# Patient Record
Sex: Female | Born: 1945 | Race: White | Hispanic: No | Smoking: Never smoker
Health system: Southern US, Community
[De-identification: ages and names within clinical notes are randomized; demographics above are authoritative.]

## PROBLEM LIST (undated history)

## (undated) DIAGNOSIS — I493 Ventricular premature depolarization: Secondary | ICD-10-CM

## (undated) DIAGNOSIS — N63 Unspecified lump in unspecified breast: Secondary | ICD-10-CM

## (undated) DIAGNOSIS — R011 Cardiac murmur, unspecified: Secondary | ICD-10-CM

## (undated) DIAGNOSIS — E785 Hyperlipidemia, unspecified: Secondary | ICD-10-CM

## (undated) HISTORY — DX: Ventricular premature depolarization: I49.3

## (undated) HISTORY — DX: Unspecified lump in unspecified breast: N63.0

## (undated) HISTORY — DX: Hyperlipidemia, unspecified: E78.5

## (undated) HISTORY — DX: Cardiac murmur, unspecified: R01.1

## (undated) HISTORY — PX: FOOT FRACTURE SURGERY: SHX645

---

## 2006-07-26 ENCOUNTER — Ambulatory Visit: Payer: Self-pay | Admitting: Family Medicine

## 2006-08-05 ENCOUNTER — Encounter: Admission: RE | Admit: 2006-08-05 | Discharge: 2006-08-05 | Payer: Self-pay | Admitting: Family Medicine

## 2006-08-19 ENCOUNTER — Encounter: Admission: RE | Admit: 2006-08-19 | Discharge: 2006-08-19 | Payer: Self-pay | Admitting: Family Medicine

## 2006-08-26 ENCOUNTER — Other Ambulatory Visit: Admission: RE | Admit: 2006-08-26 | Discharge: 2006-08-26 | Payer: Self-pay | Admitting: Family Medicine

## 2006-08-26 ENCOUNTER — Ambulatory Visit: Payer: Self-pay | Admitting: Family Medicine

## 2006-08-26 ENCOUNTER — Encounter (INDEPENDENT_AMBULATORY_CARE_PROVIDER_SITE_OTHER): Payer: Self-pay | Admitting: Family Medicine

## 2006-08-26 LAB — CONVERTED CEMR LAB
HCT: 39.3 % (ref 36.0–46.0)
HDL: 80.8 mg/dL (ref >39.0)
MCHC: 32.6 g/dL (ref 30.0–36.0)
MCV: 93.3 fL (ref 78.0–100.0)
RDW: 11.2 % — ABNORMAL LOW (ref 11.5–14.6)
TSH: 1.05 microintl units/mL (ref 0.35–5.50)
VLDL: 11 mg/dL (ref 0–40)
WBC: 4.5 10*3/uL (ref 4.5–10.5)

## 2006-09-17 ENCOUNTER — Ambulatory Visit: Payer: Self-pay | Admitting: Internal Medicine

## 2007-01-16 DIAGNOSIS — N63 Unspecified lump in unspecified breast: Secondary | ICD-10-CM | POA: Insufficient documentation

## 2007-01-16 DIAGNOSIS — E739 Lactose intolerance, unspecified: Secondary | ICD-10-CM | POA: Insufficient documentation

## 2007-10-17 ENCOUNTER — Telehealth (INDEPENDENT_AMBULATORY_CARE_PROVIDER_SITE_OTHER): Payer: Self-pay | Admitting: *Deleted

## 2007-10-20 ENCOUNTER — Ambulatory Visit: Payer: Self-pay | Admitting: Internal Medicine

## 2007-10-20 DIAGNOSIS — I868 Varicose veins of other specified sites: Secondary | ICD-10-CM

## 2007-10-20 DIAGNOSIS — I8390 Asymptomatic varicose veins of unspecified lower extremity: Secondary | ICD-10-CM | POA: Insufficient documentation

## 2007-10-20 DIAGNOSIS — G5600 Carpal tunnel syndrome, unspecified upper limb: Secondary | ICD-10-CM | POA: Insufficient documentation

## 2009-09-04 ENCOUNTER — Encounter: Payer: Self-pay | Admitting: Internal Medicine

## 2010-11-11 ENCOUNTER — Encounter: Payer: Self-pay | Admitting: Family Medicine

## 2010-12-22 ENCOUNTER — Other Ambulatory Visit: Payer: Self-pay | Admitting: Family Medicine

## 2010-12-22 ENCOUNTER — Encounter: Payer: Self-pay | Admitting: Family Medicine

## 2010-12-22 ENCOUNTER — Encounter (INDEPENDENT_AMBULATORY_CARE_PROVIDER_SITE_OTHER): Payer: BC Managed Care – PPO | Admitting: Family Medicine

## 2010-12-22 ENCOUNTER — Other Ambulatory Visit (HOSPITAL_COMMUNITY)
Admission: RE | Admit: 2010-12-22 | Discharge: 2010-12-22 | Disposition: A | Discharge status: Home / Self Care | Payer: BC Managed Care – PPO | Source: Ambulatory Visit | Attending: Family Medicine | Admitting: Family Medicine

## 2010-12-22 DIAGNOSIS — Z0000 Encounter for general adult medical examination without abnormal findings: Secondary | ICD-10-CM

## 2010-12-22 DIAGNOSIS — Z01419 Encounter for gynecological examination (general) (routine) without abnormal findings: Secondary | ICD-10-CM

## 2010-12-22 DIAGNOSIS — N63 Unspecified lump in unspecified breast: Secondary | ICD-10-CM

## 2010-12-22 DIAGNOSIS — E785 Hyperlipidemia, unspecified: Secondary | ICD-10-CM

## 2010-12-22 DIAGNOSIS — L719 Rosacea, unspecified: Secondary | ICD-10-CM

## 2010-12-22 DIAGNOSIS — Z124 Encounter for screening for malignant neoplasm of cervix: Secondary | ICD-10-CM

## 2010-12-22 DIAGNOSIS — Z780 Asymptomatic menopausal state: Secondary | ICD-10-CM

## 2010-12-22 LAB — BASIC METABOLIC PANEL
BUN: 20 mg/dL (ref 6–23)
CO2: 29 mEq/L (ref 19–32)
Calcium: 9.1 mg/dL (ref 8.4–10.5)
Chloride: 104 mEq/L (ref 96–112)
Creatinine, Ser: 0.8 mg/dL (ref 0.4–1.2)

## 2010-12-22 LAB — LIPID PANEL
Cholesterol: 226 mg/dL — ABNORMAL HIGH (ref 0–200)
Triglycerides: 27 mg/dL (ref 0.0–149.0)
VLDL: 5.4 mg/dL (ref 0.0–40.0)

## 2010-12-22 LAB — CBC WITH DIFFERENTIAL/PLATELET
Basophils Absolute: 0 10*3/uL (ref 0.0–0.1)
Eosinophils Absolute: 0.1 10*3/uL (ref 0.0–0.7)
Lymphocytes Relative: 32.8 % (ref 12.0–46.0)
MCHC: 33.8 g/dL (ref 30.0–36.0)
MCV: 93.2 fl (ref 78.0–100.0)
Monocytes Absolute: 0.4 10*3/uL (ref 0.1–1.0)
Neutrophils Relative %: 57.2 % (ref 43.0–77.0)
Platelets: 280 10*3/uL (ref 150.0–400.0)

## 2010-12-22 LAB — HEPATIC FUNCTION PANEL
Bilirubin, Direct: 0.1 mg/dL (ref 0.0–0.3)
Total Bilirubin: 0.5 mg/dL (ref 0.3–1.2)

## 2010-12-22 LAB — LDL CHOLESTEROL, DIRECT: Direct LDL: 127.6 mg/dL

## 2010-12-25 LAB — CONVERTED CEMR LAB: Vit D, 25-Hydroxy: 17 ng/mL — ABNORMAL LOW (ref 30–89)

## 2010-12-26 ENCOUNTER — Encounter (INDEPENDENT_AMBULATORY_CARE_PROVIDER_SITE_OTHER): Payer: Self-pay | Admitting: *Deleted

## 2010-12-29 ENCOUNTER — Ambulatory Visit
Admission: RE | Admit: 2010-12-29 | Discharge: 2010-12-29 | Disposition: A | Discharge status: Home / Self Care | Payer: BC Managed Care – PPO | Source: Ambulatory Visit | Attending: Family Medicine | Admitting: Family Medicine

## 2010-12-29 ENCOUNTER — Other Ambulatory Visit: Payer: Self-pay | Admitting: Family Medicine

## 2010-12-29 DIAGNOSIS — Z780 Asymptomatic menopausal state: Secondary | ICD-10-CM

## 2010-12-29 DIAGNOSIS — N63 Unspecified lump in unspecified breast: Secondary | ICD-10-CM

## 2011-01-02 NOTE — Letter (Signed)
Summary: Pre Visit Letter Revised  [facility]  [address] [place], [place] [address]   Phone: [phone]  Fax: [phone]        12/26/2010 MRN: [id number] [facility] [address] [place], [place]  [address]  [place]             Procedure Date:  02/05/2011 @ 9:00   Direct colon-Dr. Marina Goodell   Welcome to the Gastroenterology Division at [facility].    You are scheduled to see a nurse for your pre-procedure visit on 01/22/2011 at 10:30 on the 3rd floor at [facility], [address]  We ask that you try to arrive at our office 15 minutes prior to your appointment time to allow for check-in.  Please take a minute to review the attached form.  If you answer "Yes" to one or more of the questions on the first page, we ask that you call the person listed at your earliest opportunity.  If you answer "No" to all of the questions, please complete the rest of the form and bring it to your appointment.    Your nurse visit will consist of discussing your medical and surgical history, your immediate family medical history, and your medications.   If you are unable to list all of your medications on the form, please bring the medication bottles to your appointment and we will list them.  We will need to be aware of both prescribed and over the counter drugs.  We will need to know exact dosage information as well.    Please be prepared to read and sign documents such as consent forms, a financial agreement, and acknowledgement forms.  If necessary, and with your consent, a friend or relative is welcome to sit-in on the nurse visit with you.  Please bring your insurance card so that we may make a copy of it.  If your insurance requires a referral to see a specialist, please bring your referral form from your primary care physician.  No co-pay is required for this nurse visit.     If you cannot keep your appointment, please call [phone] to cancel or reschedule  prior to your appointment date.  This allows [place] the opportunity to schedule an appointment for another patient in need of care.    Thank you for choosing [facility] for your medical needs.  We appreciate the opportunity to care for you.  Please visit [place] at our website  to learn more about our practice.  Sincerely, The Gastroenterology Division

## 2011-01-09 NOTE — Assessment & Plan Note (Signed)
Summary: PHYSICAL AND FASTING LABS, [employer], RASH ON FACE FOR 4 WEEKS///SPH   Vital Signs:  Patient profile:   65 year old female Height:      62.25 inches Weight:      135 pounds Temp:     97.5 degrees F oral Pulse rate:   62 / minute Resp:     16 per minute BP sitting:   118 / 76  (left arm)  Vitals Entered By: Jeremy Johann CMA (December 22, 2010 10:31 AM) CC: CPX, fasting, pap smear   History of Present Illness: 65 yo woman here today for CPE.  last mammogram >2 yrs ago, overdue on pap.  has never had colonoscopy due to out of pocket costs.  facial rash- this is 4th week of current breakout, has been having similar sxs 'for years'.  denies itching or pain.  pt feels this may be rosacea.  will frequently have areas between eyes, extending onto forehead, chin, and malar region.  has never seen dermatology.  Preventive Screening-Counseling & Management  Alcohol-Tobacco     Alcohol drinks/day: 0     Smoking Status: never  Caffeine-Diet-Exercise     Does Patient Exercise: yes     Type of exercise: walking, [place]      Drug Use:  never.    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  Past Surgical History: none  Family History: Breast Cancer- no Alzheimer's- mother limited info due to foster system  Social History: divorced no children retired Runner, broadcasting/film/video- works as a Engineer, technical sales and sub was a foster child lives w/ catSmoking Status:  never Does Patient Exercise:  yes Drug Use:  never  Review of Systems  The patient denies anorexia, fever, weight loss, weight gain, vision loss, decreased hearing, hoarseness, chest pain, syncope, dyspnea on exertion, peripheral edema, prolonged cough, headaches, abdominal pain, melena, hematochezia, severe indigestion/heartburn, hematuria, suspicious skin lesions, depression, abnormal bleeding,  enlarged lymph nodes, and breast masses.    Physical Exam  General:  Well-developed,well-nourished,in no acute distress; alert,appropriate and cooperative throughout examination Head:  Normocephalic and atraumatic without obvious abnormalities. No apparent alopecia or balding. Eyes:  No corneal or conjunctival inflammation noted. EOMI. Perrla. Funduscopic exam benign, without hemorrhages, exudates or papilledema. Vision grossly normal. Ears:  External ear exam shows no significant lesions or deformities.  Otoscopic examination reveals clear canals, tympanic membranes are intact bilaterally without bulging, retraction, inflammation or discharge. Hearing is grossly normal bilaterally. Nose:  External nasal examination shows no deformity or inflammation. Nasal mucosa are pink and moist without lesions or exudates. Mouth:  Oral mucosa and oropharynx without lesions or exudates.  Teeth in good repair. Neck:  No deformities, masses, or tenderness noted. Breasts:  small freely mobile mass in L upper outer quadrant  ~1 o'clock.  remainder of breast exam normal Lungs:  Normal respiratory effort, chest expands symmetrically. Lungs are clear to auscultation, no crackles or wheezes. Heart:  reg S1/S2, faint midsystolic click. Abdomen:  Bowel sounds positive,abdomen soft and non-tender without masses, organomegaly or hernias noted. Genitalia:  Pelvic Exam:        External: normal female genitalia without lesions or masses        Vagina: normal without lesions or masses        Cervix: normal without lesions or masses        Adnexa: normal bimanual exam without masses or fullness  Uterus: normal by palpation        Pap smear: performed Pulses:  +2 carotid, radial, DP Extremities:  No clubbing, cyanosis, edema, or deformity noted with normal full range of motion of all joints.   Neurologic:  No cranial nerve deficits noted. Station and gait are normal. Plantar reflexes are down-going bilaterally. DTRs  are symmetrical throughout. Sensory, motor and coordinative functions appear intact. Skin:  erythematous, dry, scaling rash in malar distribution, between eyebrows, and extending onto chin. Cervical Nodes:  No lymphadenopathy noted Axillary Nodes:  No palpable lymphadenopathy Psych:  Cognition and judgment appear intact. Alert and cooperative with normal attention span and concentration. No apparent delusions, illusions, hallucinations   Impression & Recommendations:  Problem # 1:  ROUTINE GYNECOLOGICAL EXAMINATION (ICD-V72.31) Assessment New pt's PE WNL w/ exception of rash (see below).  check labs.  refer for mammogram, DEXA, colonoscopy. 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  Keep up the good work! 2)  We'll notify you of your lab results 3)  Someone will call you with your dermatology appt, your GI appt, and your mammogram/bone density appts 4)  Call with any questions or concerns 5)  Welcome Back!   Orders Added: 1)  Venipuncture [36415] 2)  T-Vitamin D (25-Hydroxy) [29562-13086] 3)  EKG w/ Interpretation [93000] 4)  Specimen Handling [99000] 5)  Dermatology Referral [Derma] 6)  Gastroenterology Referral [GI] 7)  Radiology Referral [Radiology] 8)  TLB-Lipid Panel [80061-LIPID] 9)  TLB-BMP (Basic  Metabolic Panel-BMET) [80048-METABOL] 10)  TLB-CBC Platelet - w/Differential [85025-CBCD] 11)  TLB-Hepatic/Liver Function Pnl [80076-HEPATIC] 12)  TLB-TSH (Thyroid Stimulating Hormone) [84443-TSH] 13)  New Patient 40-64 years [phone]

## 2011-01-22 ENCOUNTER — Telehealth: Payer: Self-pay | Admitting: *Deleted

## 2011-01-22 NOTE — Telephone Encounter (Signed)
Left message for pt. To [facility] her colon and PV.  Sent NOS letter

## 2011-01-22 NOTE — Telephone Encounter (Signed)
Pt. Called and cancelled her colon.

## 2011-02-05 ENCOUNTER — Other Ambulatory Visit: Payer: BC Managed Care – PPO | Admitting: Internal Medicine

## 2011-03-09 NOTE — Assessment & Plan Note (Signed)
[facility]                          [place] OFFICE NOTE   Marilyn Hess, Marilyn Hess                    MRN:          [id number]  DATE:07/26/2006                            DOB:          1946-08-29    Marilyn Hess is a 65 year old female presenting here to establish care.  She  has no significant past medical history but complains of a long history of  leg pain associated with varicose veins.  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  This has been associated with pain and swelling  of the veins.  It has progressively worsened over the last several years.  She has used over-the-counter support hose but has never used compression  stockings.  She was wondering if there was any treatment available.   PAST MEDICAL HISTORY:  1. 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  2. Lactose intolerant.  3. Fracture of the foot in 1988.   MEDICATIONS:  None.   ALLERGIES:  NO KNOWN DRUG ALLERGIES.   FAMILY HISTORY:  Marilyn Hess was adopted by her foster parents but there is a  family history of hypertension and arthritis.   SOCIAL HISTORY:  The patient is a retired Runner, broadcasting/film/video currently working as a  Lawyer.  She is divorced with no children.  Denies any alcohol  or tobacco use.   REVIEW OF SYSTEMS:  As per HPI, otherwise unremarkable.   HEALTH MAINTENANCE:  The patient states that she has not had her mammogram  or Pap Smear in 7+ years.  Additionally, she has never had a colonoscopy.  __________.   OBJECTIVE:  Weight 139, temperature 97.6, pulse 64, blood pressure 138/80.  GENERAL:  We have a pleasant female in no acute distress, answers questions  appropriately.  NECK:  Supple.  No lymphadenopathy, carotid bruits or JVD, no thyromegaly.  LUNGS:  Clear.  HEART:  Regular rate and rhythm.   Normal S1, S2.  No murmurs, gallops or  rubs heard on my examination today.  EXTREMITIES:  There is marked varicosity extending from the mid thigh to the  ankle.  Some are more prominent than others and palpable.  No lower  extremity edema appreciated.  Pulses are 1+ and equal bilaterally.   IMPRESSION:  A 60yld female with no significant past medical history  presenting with symptomatic varicosities of the lower extremities.   PLAN:  1. Prescribed compression stockings at 20-30 mmHg of pressure.  Advised to      wear daily.  2. Will refer patient to [facility] for further evaluation and      treatment.  3. Regarding her health maintenance issues, will schedule her for a      mammogram as well as a screening for her colonoscopy by      Gastroenterology.  4. Patient will schedule a follow up visit for a complete physical      examination including breast and pelvic exam.  Patient expressed  understanding.            ______________________________  Leanne Chang, M.D.      LA/MedQ  DD:  07/26/2006  DT:  07/29/2006  Job #:  [id number]

## 2011-07-23 HISTORY — PX: TOOTH EXTRACTION: SUR596

## 2012-07-10 ENCOUNTER — Encounter: Payer: Self-pay | Admitting: *Deleted

## 2012-07-10 ENCOUNTER — Ambulatory Visit (INDEPENDENT_AMBULATORY_CARE_PROVIDER_SITE_OTHER): Payer: BC Managed Care – PPO | Admitting: Family Medicine

## 2012-07-10 ENCOUNTER — Encounter: Payer: Self-pay | Admitting: Family Medicine

## 2012-07-10 VITALS — BP 117/72 | HR 73 | Temp 97.8°F | Ht 62.25 in | Wt 146.4 lb

## 2012-07-10 DIAGNOSIS — Z1239 Encounter for other screening for malignant neoplasm of breast: Secondary | ICD-10-CM

## 2012-07-10 DIAGNOSIS — M8580 Other specified disorders of bone density and structure, unspecified site: Secondary | ICD-10-CM

## 2012-07-10 DIAGNOSIS — Z1211 Encounter for screening for malignant neoplasm of colon: Secondary | ICD-10-CM

## 2012-07-10 DIAGNOSIS — M899 Disorder of bone, unspecified: Secondary | ICD-10-CM

## 2012-07-10 DIAGNOSIS — Z0000 Encounter for general adult medical examination without abnormal findings: Secondary | ICD-10-CM | POA: Insufficient documentation

## 2012-07-10 LAB — CBC WITH DIFFERENTIAL/PLATELET
Basophils Absolute: 0 10*3/uL (ref 0.0–0.1)
Basophils Relative: 0.8 % (ref 0.0–3.0)
Eosinophils Relative: 1.8 % (ref 0.0–5.0)
Hemoglobin: 12.9 g/dL (ref 12.0–15.0)
Lymphocytes Relative: 30.4 % (ref 12.0–46.0)
Monocytes Relative: 9.4 % (ref 3.0–12.0)
Neutro Abs: 2.6 10*3/uL (ref 1.4–7.7)
RBC: 4.26 Mil/uL (ref 3.87–5.11)
RDW: 12.6 % (ref 11.5–14.6)
WBC: 4.5 10*3/uL (ref 4.5–10.5)

## 2012-07-10 LAB — HEPATIC FUNCTION PANEL
ALT: 15 U/L (ref 0–35)
Bilirubin, Direct: 0 mg/dL (ref 0.0–0.3)
Total Protein: 6.8 g/dL (ref 6.0–8.3)

## 2012-07-10 LAB — LIPID PANEL
Cholesterol: 227 mg/dL — ABNORMAL HIGH (ref 0–200)
Total CHOL/HDL Ratio: 3

## 2012-07-10 LAB — BASIC METABOLIC PANEL
CO2: 29 mEq/L (ref 19–32)
Chloride: 104 mEq/L (ref 96–112)
Creatinine, Ser: 0.8 mg/dL (ref 0.4–1.2)

## 2012-07-10 LAB — LDL CHOLESTEROL, DIRECT: Direct LDL: 118.4 mg/dL

## 2012-07-10 NOTE — Progress Notes (Signed)
  Subjective:    Patient ID: Marilyn Hess, female    DOB: [birth year], 66 y.o.   MRN: [id number]  HPI CPE- due for mammo ([facility]), has never had colonoscopy due to cost- was told it may be up to $2600.  Had pap last year.     Review of Systems Patient reports no vision/ hearing changes, adenopathy,fever, weight change,  persistant/recurrent hoarseness , swallowing issues, chest pain, palpitations, edema, persistant/recurrent cough, hemoptysis, dyspnea (rest/exertional/paroxysmal nocturnal), gastrointestinal bleeding (melena, rectal bleeding), abdominal pain, significant heartburn, bowel changes, GU symptoms (dysuria, hematuria, incontinence), Gyn symptoms (abnormal  bleeding, pain),  syncope, focal weakness, memory loss, skin/hair/nail changes, abnormal bruising or bleeding, anxiety, or depression.   + numbness in hands due to carpal tunnel    Objective:   Physical Exam  General Appearance:    Alert, cooperative, no distress, appears stated age  Head:    Normocephalic, without obvious abnormality, atraumatic  Eyes:    PERRL, conjunctiva/corneas clear, EOM's intact, fundi    benign, both eyes  Ears:    Normal TM's and external ear canals, both ears  Nose:   Nares normal, septum midline, mucosa normal, no drainage    or sinus tenderness  Throat:   Lips, mucosa, and tongue normal; teeth and gums normal  Neck:   Supple, symmetrical, trachea midline, no adenopathy;    Thyroid: no enlargement/tenderness/nodules  Back:     Symmetric, no curvature, ROM normal, no CVA tenderness  Lungs:     Clear to auscultation bilaterally, respirations unlabored  Chest Wall:    No tenderness or deformity   Heart:    Regular rate and rhythm, S1 and S2 normal, no murmur, rub   or gallop  Breast Exam:    No tenderness, masses, or nipple abnormality  Abdomen:     Soft, non-tender, bowel sounds active all four quadrants,    no masses, no organomegaly  Genitalia:    Deferred at pt request this  year  Rectal:    Extremities:   Extremities normal, atraumatic, no cyanosis or edema  Pulses:   2+ and symmetric all extremities  Skin:   Skin color, texture, turgor normal, no rashes or lesions  Lymph nodes:   Cervical, supraclavicular, and axillary nodes normal  Neurologic:   CNII-XII intact, normal strength, sensation and reflexes    throughout          Assessment & Plan:

## 2012-07-10 NOTE — Patient Instructions (Addendum)
Follow up in 1 year or as needed We'll notify you of your lab results and make any changes if needed Start 2 caltrate daily We'll call you with your GI and mammo appts Call with any questions or concerns Happy Early Birthday!!

## 2012-07-13 LAB — VITAMIN D 1,25 DIHYDROXY
Vitamin D 1, 25 (OH)2 Total: 44 pg/mL (ref 18–72)
Vitamin D2 1, 25 (OH)2: <8 pg/mL

## 2012-07-15 NOTE — Assessment & Plan Note (Signed)
Pt's PE WNL.  Refer for colonoscopy- pt to check w/ insurance.  Will also refer for mammo.  Check labs.  Anticipatory guidance provided.

## 2012-07-15 NOTE — Assessment & Plan Note (Signed)
Check Vit D level.  Replete prn. °

## 2012-07-16 ENCOUNTER — Encounter: Payer: Self-pay | Admitting: *Deleted

## 2012-07-17 ENCOUNTER — Ambulatory Visit
Admission: RE | Admit: 2012-07-17 | Discharge: 2012-07-17 | Disposition: A | Discharge status: Home / Self Care | Payer: BC Managed Care – PPO | Source: Ambulatory Visit | Attending: Family Medicine | Admitting: Family Medicine

## 2012-07-17 DIAGNOSIS — Z1239 Encounter for other screening for malignant neoplasm of breast: Secondary | ICD-10-CM

## 2012-07-21 ENCOUNTER — Encounter: Payer: Self-pay | Admitting: Family Medicine

## 2012-08-04 ENCOUNTER — Telehealth: Payer: Self-pay | Admitting: Family Medicine

## 2012-08-04 NOTE — Telephone Encounter (Signed)
In reference to gastroenterology referral entered on 07/10/12, [facility], and I have attempted to reach patient, left messages, and I also mailed reminder letter to patient.  This patient will not respond.

## 2012-08-04 NOTE — Telephone Encounter (Signed)
Noted thank you

## 2014-08-05 ENCOUNTER — Ambulatory Visit (INDEPENDENT_AMBULATORY_CARE_PROVIDER_SITE_OTHER): Payer: Medicare PPO | Admitting: Family Medicine

## 2014-08-05 ENCOUNTER — Encounter: Payer: Self-pay | Admitting: Family Medicine

## 2014-08-05 VITALS — BP 122/82 | HR 76 | Temp 98.0°F | Resp 16 | Ht 62.0 in | Wt 142.2 lb

## 2014-08-05 DIAGNOSIS — Z1211 Encounter for screening for malignant neoplasm of colon: Secondary | ICD-10-CM

## 2014-08-05 DIAGNOSIS — G5602 Carpal tunnel syndrome, left upper limb: Secondary | ICD-10-CM

## 2014-08-05 DIAGNOSIS — G5603 Carpal tunnel syndrome, bilateral upper limbs: Secondary | ICD-10-CM

## 2014-08-05 DIAGNOSIS — Z1231 Encounter for screening mammogram for malignant neoplasm of breast: Secondary | ICD-10-CM

## 2014-08-05 DIAGNOSIS — Z0000 Encounter for general adult medical examination without abnormal findings: Secondary | ICD-10-CM | POA: Diagnosis not present

## 2014-08-05 DIAGNOSIS — M8580 Other specified disorders of bone density and structure, unspecified site: Secondary | ICD-10-CM

## 2014-08-05 DIAGNOSIS — G5601 Carpal tunnel syndrome, right upper limb: Secondary | ICD-10-CM

## 2014-08-05 DIAGNOSIS — Z79899 Other long term (current) drug therapy: Secondary | ICD-10-CM

## 2014-08-05 DIAGNOSIS — E663 Overweight: Secondary | ICD-10-CM | POA: Diagnosis not present

## 2014-08-05 DIAGNOSIS — Z23 Encounter for immunization: Secondary | ICD-10-CM

## 2014-08-05 LAB — LIPID PANEL
CHOL/HDL RATIO: 3
CHOLESTEROL: 238 mg/dL — AB (ref 0–200)
HDL: 77.1 mg/dL (ref >39.00)
LDL CALC: 148 mg/dL — AB (ref 0–99)
NonHDL: 160.9
TRIGLYCERIDES: 67 mg/dL (ref 0.0–149.0)
VLDL: 13.4 mg/dL (ref 0.0–40.0)

## 2014-08-05 LAB — CBC WITH DIFFERENTIAL/PLATELET
BASOS ABS: 0 10*3/uL (ref 0.0–0.1)
Basophils Relative: 0.7 % (ref 0.0–3.0)
EOS PCT: 2.9 % (ref 0.0–5.0)
Eosinophils Absolute: 0.1 10*3/uL (ref 0.0–0.7)
HEMATOCRIT: 40.2 % (ref 36.0–46.0)
HEMOGLOBIN: 13.2 g/dL (ref 12.0–15.0)
LYMPHS PCT: 34.8 % (ref 12.0–46.0)
Lymphs Abs: 1.7 10*3/uL (ref 0.7–4.0)
MCHC: 32.8 g/dL (ref 30.0–36.0)
MCV: 91.9 fl (ref 78.0–100.0)
MONOS PCT: 8.1 % (ref 3.0–12.0)
Monocytes Absolute: 0.4 10*3/uL (ref 0.1–1.0)
NEUTROS ABS: 2.6 10*3/uL (ref 1.4–7.7)
Neutrophils Relative %: 53.5 % (ref 43.0–77.0)
Platelets: 263 10*3/uL (ref 150.0–400.0)
RBC: 4.38 Mil/uL (ref 3.87–5.11)
RDW: 12.4 % (ref 11.5–15.5)
WBC: 4.9 10*3/uL (ref 4.0–10.5)

## 2014-08-05 LAB — HEPATIC FUNCTION PANEL
ALBUMIN: 3.8 g/dL (ref 3.5–5.2)
ALK PHOS: 59 U/L (ref 39–117)
ALT: 14 U/L (ref 0–35)
AST: 27 U/L (ref 0–37)
Bilirubin, Direct: 0.1 mg/dL (ref 0.0–0.3)
TOTAL PROTEIN: 7.2 g/dL (ref 6.0–8.3)
Total Bilirubin: 0.9 mg/dL (ref 0.2–1.2)

## 2014-08-05 LAB — BASIC METABOLIC PANEL
BUN: 15 mg/dL (ref 6–23)
CALCIUM: 9.7 mg/dL (ref 8.4–10.5)
CO2: 24 mEq/L (ref 19–32)
CREATININE: 0.9 mg/dL (ref 0.4–1.2)
Chloride: 103 mEq/L (ref 96–112)
GFR: 66.57 mL/min (ref >60.00)
GLUCOSE: 87 mg/dL (ref 70–99)
Potassium: 4.9 mEq/L (ref 3.5–5.1)
Sodium: 141 mEq/L (ref 135–145)

## 2014-08-05 LAB — TSH: TSH: 1.68 u[IU]/mL (ref 0.35–4.50)

## 2014-08-05 LAB — VITAMIN D 25 HYDROXY (VIT D DEFICIENCY, FRACTURES): VITD: 31.1 ng/mL (ref 30.00–100.00)

## 2014-08-05 NOTE — Assessment & Plan Note (Signed)
New.  Check labs to risk stratify.  Encouraged healthy, low carb diet and regular exercise.  Pt expressed understanding and is in agreement w/ plan.

## 2014-08-05 NOTE — Progress Notes (Signed)
Pre visit review using our clinic review tool, if applicable. No additional management support is needed unless otherwise documented below in the visit note. 

## 2014-08-05 NOTE — Assessment & Plan Note (Signed)
Due for DEXA- order entered.  Encouraged daily use of Ca and Vit D

## 2014-08-05 NOTE — Progress Notes (Signed)
   Subjective:    Patient ID: Marilyn Hess, female    DOB: 1946/03/15, 68 y.o.   MRN: [id number]  HPI Here today for CPE.  Risk Factors: Overweight- pt not following particular diet.  BMI 26 Osteopenia- noted on last DEXA done 2012, not currently on Ca or Vit D Physical Activity: walking regularly, Silver Sneakers Fall Risk: low risk Depression: denies current Hearing: normal to conversational tones and whispered voice at 6 ft ADL's: independent Cognitive: normal linear thought process, memory and attention intact Home Safety: safe at home Height, Weight, BMI, Visual Acuity: see vitals, vision corrected to 20/20 w/ glasses Counseling: Overdue on DEXA, [facility] ([facility]), no need for paps, pt has never had colonoscopy. Labs Ordered: See A&P Care Plan: See A&P    Review of Systems Patient reports no vision/ hearing changes, adenopathy,fever, weight change,  persistant/recurrent hoarseness , swallowing issues, chest pain, palpitations, edema, persistant/recurrent cough, hemoptysis, dyspnea (rest/exertional/paroxysmal nocturnal), gastrointestinal bleeding (melena, rectal bleeding), abdominal pain, significant heartburn, bowel changes, GU symptoms (dysuria, hematuria, incontinence), Gyn symptoms (abnormal  bleeding, pain), syncope, memory loss, skin/hair/nail changes, abnormal bruising or bleeding, anxiety, or depression.   + numbness in hands consistent w/ carpal tunnel- worsening x2-3 weeks    Objective:   Physical Exam General Appearance:    Alert, cooperative, no distress, appears stated age  Head:    Normocephalic, without obvious abnormality, atraumatic  Eyes:    PERRL, conjunctiva/corneas clear, EOM's intact, fundi    benign, both eyes  Ears:    Normal TM's and external ear canals, both ears  Nose:   Nares normal, septum midline, mucosa normal, no drainage    or sinus tenderness  Throat:   Lips, mucosa, and tongue normal; teeth and gums normal  Neck:   Supple,  symmetrical, trachea midline, no adenopathy;    Thyroid: no enlargement/tenderness/nodules  Back:     Symmetric, no curvature, ROM normal, no CVA tenderness  Lungs:     Clear to auscultation bilaterally, respirations unlabored  Chest Wall:    No tenderness or deformity   Heart:    Regular rate and rhythm, S1 and S2 normal, no murmur, rub   or gallop  Breast Exam:    Deferred to mammo  Abdomen:     Soft, non-tender, bowel sounds active all four quadrants,    no masses, no organomegaly  Genitalia:    Deferred  Rectal:    Extremities:   Extremities normal, atraumatic, no cyanosis or edema  Pulses:   2+ and symmetric all extremities  Skin:   Skin color, texture, turgor normal, no rashes or lesions  Lymph nodes:   Cervical, supraclavicular, and axillary nodes normal  Neurologic:   CNII-XII intact, normal strength, sensation and reflexes    throughout          Assessment & Plan:

## 2014-08-05 NOTE — Patient Instructions (Signed)
Follow up in 1 year or as needed We'll notify you of your lab results We'll call you with your mammo, bone density, GI, and hand specialist appts We did the Prevnar today.  Get your flu shot at the pharmacy Call with any questions or concerns Keep up the good work!

## 2014-08-05 NOTE — Assessment & Plan Note (Signed)
Pt's PE WNL w/ exception of being mildly overweight.  Due for mammo, DEXA, colonoscopy- orders/referrals entered.  Written screening schedule updated and given to pt.  Check labs.  Anticipatory guidance provided.

## 2014-08-05 NOTE — Assessment & Plan Note (Signed)
Deteriorated.  Pt having near constant numbness now.  Refer to hand specialist.  Pt expressed understanding and is in agreement w/ plan.

## 2014-08-06 ENCOUNTER — Encounter: Payer: Self-pay | Admitting: General Practice

## 2014-08-24 ENCOUNTER — Ambulatory Visit
Admission: RE | Admit: 2014-08-24 | Discharge: 2014-08-24 | Disposition: A | Discharge status: Home / Self Care | Payer: Medicare PPO | Source: Ambulatory Visit | Attending: Family Medicine | Admitting: Family Medicine

## 2014-08-24 ENCOUNTER — Encounter: Payer: Self-pay | Admitting: General Practice

## 2014-08-24 ENCOUNTER — Other Ambulatory Visit: Payer: Self-pay | Admitting: Family Medicine

## 2014-08-24 DIAGNOSIS — M8580 Other specified disorders of bone density and structure, unspecified site: Secondary | ICD-10-CM

## 2014-08-24 DIAGNOSIS — Z1231 Encounter for screening mammogram for malignant neoplasm of breast: Secondary | ICD-10-CM

## 2014-09-13 ENCOUNTER — Encounter: Payer: Self-pay | Admitting: Family Medicine

## 2014-10-22 HISTORY — PX: COLONOSCOPY: SHX174

## 2014-11-22 HISTORY — PX: CARPAL TUNNEL RELEASE: SHX101

## 2015-02-07 ENCOUNTER — Encounter: Payer: Self-pay | Admitting: Family Medicine

## 2015-02-07 ENCOUNTER — Ambulatory Visit (INDEPENDENT_AMBULATORY_CARE_PROVIDER_SITE_OTHER): Payer: Medicare PPO | Admitting: Family Medicine

## 2015-02-07 VITALS — BP 130/80 | HR 68 | Temp 98.0°F | Resp 16 | Wt 142.0 lb

## 2015-02-07 DIAGNOSIS — E785 Hyperlipidemia, unspecified: Secondary | ICD-10-CM | POA: Insufficient documentation

## 2015-02-07 NOTE — Assessment & Plan Note (Signed)
Noted on last labs.  Pt prefers diet and exercise to medication.  Check labs and determine if meds are necessary.  Pt expressed understanding and is in agreement w/ plan.

## 2015-02-07 NOTE — Patient Instructions (Signed)
Schedule your complete physical in 6 months We'll notify you of your lab results and make any changes if needed Keep up the good work on healthy diet and regular exercise- you look great! Call with any questions or concerns Happy Spring!  Enjoy the beach!!!

## 2015-02-07 NOTE — Progress Notes (Signed)
   Subjective:    Patient ID: Marilyn Hess, female    DOB: [birth year], 69 y.o.   MRN: [id number]  HPI Hyperlipidemia- noted on labs done 6 months ago.  Pt declined medication and wanted to work on healthy diet and regular exercise.  Denies CP, SOB, HAs, visual changes, abd pain, N/V.   Review of Systems For ROS see HPI     Objective:   Physical Exam  Constitutional: She is oriented to person, place, and time. She appears well-developed and well-nourished. No distress.  HENT:  Head: Normocephalic and atraumatic.  Eyes: Conjunctivae and EOM are normal. Pupils are equal, round, and reactive to light.  Neck: Normal range of motion. Neck supple. No thyromegaly present.  Cardiovascular: Normal rate, regular rhythm, normal heart sounds and intact distal pulses.   No murmur heard. Pulmonary/Chest: Effort normal and breath sounds normal. No respiratory distress.  Abdominal: Soft. She exhibits no distension. There is no tenderness.  Musculoskeletal: She exhibits no edema.  Lymphadenopathy:    She has no cervical adenopathy.  Neurological: She is alert and oriented to person, place, and time.  Skin: Skin is warm and dry.  Psychiatric: She has a normal mood and affect. Her behavior is normal.  Vitals reviewed.         Assessment & Plan:

## 2015-02-07 NOTE — Progress Notes (Signed)
Pre visit review using our clinic review tool, if applicable. No additional management support is needed unless otherwise documented below in the visit note. 

## 2015-02-16 ENCOUNTER — Telehealth: Payer: Self-pay | Admitting: *Deleted

## 2015-02-16 NOTE — Telephone Encounter (Signed)
Spoke with pt to schedule appointment redraw - bmp , hep , lipid. [place] at beach . Pt to call back to schedule redraw.

## 2015-02-23 ENCOUNTER — Other Ambulatory Visit (INDEPENDENT_AMBULATORY_CARE_PROVIDER_SITE_OTHER): Payer: Medicare PPO

## 2015-02-23 DIAGNOSIS — E785 Hyperlipidemia, unspecified: Secondary | ICD-10-CM

## 2015-02-23 LAB — BASIC METABOLIC PANEL
BUN: 21 mg/dL (ref 6–23)
CHLORIDE: 104 meq/L (ref 96–112)
CO2: 30 mEq/L (ref 19–32)
Calcium: 9.4 mg/dL (ref 8.4–10.5)
Creatinine, Ser: 0.88 mg/dL (ref 0.40–1.20)
GFR: 68.2 mL/min (ref >60.00)
Glucose, Bld: 76 mg/dL (ref 70–99)
POTASSIUM: 4.5 meq/L (ref 3.5–5.1)
Sodium: 139 mEq/L (ref 135–145)

## 2015-02-23 LAB — HEPATIC FUNCTION PANEL
ALBUMIN: 4.2 g/dL (ref 3.5–5.2)
ALT: 24 U/L (ref 0–35)
AST: 22 U/L (ref 0–37)
Alkaline Phosphatase: 63 U/L (ref 39–117)
BILIRUBIN DIRECT: 0.1 mg/dL (ref 0.0–0.3)
BILIRUBIN TOTAL: 0.3 mg/dL (ref 0.2–1.2)
TOTAL PROTEIN: 6.6 g/dL (ref 6.0–8.3)

## 2015-02-23 LAB — LIPID PANEL
CHOL/HDL RATIO: 3
Cholesterol: 225 mg/dL — ABNORMAL HIGH (ref 0–200)
HDL: 79.4 mg/dL (ref >39.00)
LDL CALC: 138 mg/dL — AB (ref 0–99)
NonHDL: 145.6
TRIGLYCERIDES: 37 mg/dL (ref 0.0–149.0)
VLDL: 7.4 mg/dL (ref 0.0–40.0)

## 2015-02-23 NOTE — Addendum Note (Signed)
Addended by: Harl Bowie on: 02/23/2015 11:42 AM   Modules accepted: Orders

## 2015-06-06 ENCOUNTER — Telehealth: Payer: Self-pay | Admitting: Family Medicine

## 2015-06-06 NOTE — Telephone Encounter (Signed)
°  Relation to WO:EHOZ Call back [phone] Pharmacy:  Reason for call: pt states that her cat bit her on her finger, pt wants to check to see if she should come in for a tetanus shot, states its a little painful but she has been using alcohol and peroxide.

## 2015-06-06 NOTE — Telephone Encounter (Signed)
No record of tetanus on file, please advise on if Tdap or regular td should be given, I will have [facility] triage to verify that cat is UTD on vaccinations.

## 2015-06-06 NOTE — Telephone Encounter (Signed)
Notified patient and she scheduled nurse visit for tdap 06/07/15.

## 2015-06-06 NOTE — Telephone Encounter (Signed)
Yes- needs Tdap and it needs to be documented in response to cat bite so that insurance will cover

## 2015-06-06 NOTE — Telephone Encounter (Signed)
Can you please triage pt, and verify that her cat is up to date on vaccinations and please schedule an appt for Tdap in regards to cat bite?

## 2015-06-07 ENCOUNTER — Ambulatory Visit (INDEPENDENT_AMBULATORY_CARE_PROVIDER_SITE_OTHER): Payer: Medicare PPO | Admitting: *Deleted

## 2015-06-07 DIAGNOSIS — Z23 Encounter for immunization: Secondary | ICD-10-CM

## 2015-06-07 DIAGNOSIS — W5501XA Bitten by cat, initial encounter: Secondary | ICD-10-CM | POA: Diagnosis not present

## 2015-06-07 NOTE — Progress Notes (Signed)
Pre visit review using our clinic review tool, if applicable. No additional management support is needed unless otherwise documented below in the visit note.  Patient tolerated injection well.  

## 2015-08-08 ENCOUNTER — Encounter: Payer: Self-pay | Admitting: Family Medicine

## 2015-08-08 ENCOUNTER — Ambulatory Visit (INDEPENDENT_AMBULATORY_CARE_PROVIDER_SITE_OTHER): Payer: Medicare PPO | Admitting: Family Medicine

## 2015-08-08 VITALS — BP 126/80 | HR 42 | Temp 98.1°F | Resp 16 | Ht 62.0 in | Wt 142.0 lb

## 2015-08-08 DIAGNOSIS — M8580 Other specified disorders of bone density and structure, unspecified site: Secondary | ICD-10-CM

## 2015-08-08 DIAGNOSIS — Z0000 Encounter for general adult medical examination without abnormal findings: Secondary | ICD-10-CM | POA: Diagnosis not present

## 2015-08-08 DIAGNOSIS — Z1211 Encounter for screening for malignant neoplasm of colon: Secondary | ICD-10-CM

## 2015-08-08 DIAGNOSIS — Z23 Encounter for immunization: Secondary | ICD-10-CM | POA: Diagnosis not present

## 2015-08-08 DIAGNOSIS — E785 Hyperlipidemia, unspecified: Secondary | ICD-10-CM

## 2015-08-08 DIAGNOSIS — E663 Overweight: Secondary | ICD-10-CM

## 2015-08-08 LAB — HEPATIC FUNCTION PANEL
ALK PHOS: 58 U/L (ref 39–117)
ALT: 23 U/L (ref 0–35)
AST: 28 U/L (ref 0–37)
Albumin: 4 g/dL (ref 3.5–5.2)
Bilirubin, Direct: 0.1 mg/dL (ref 0.0–0.3)
Total Bilirubin: 0.4 mg/dL (ref 0.2–1.2)
Total Protein: 6.4 g/dL (ref 6.0–8.3)

## 2015-08-08 LAB — BASIC METABOLIC PANEL
BUN: 25 mg/dL — ABNORMAL HIGH (ref 6–23)
CO2: 29 mEq/L (ref 19–32)
Calcium: 9.1 mg/dL (ref 8.4–10.5)
Chloride: 105 mEq/L (ref 96–112)
Creatinine, Ser: 0.86 mg/dL (ref 0.40–1.20)
GFR: 69.94 mL/min (ref >60.00)
Glucose, Bld: 88 mg/dL (ref 70–99)
POTASSIUM: 3.8 meq/L (ref 3.5–5.1)
Sodium: 140 mEq/L (ref 135–145)

## 2015-08-08 LAB — CBC WITH DIFFERENTIAL/PLATELET
Basophils Absolute: 0 10*3/uL (ref 0.0–0.1)
Basophils Relative: 1 % (ref 0.0–3.0)
EOS PCT: 2.1 % (ref 0.0–5.0)
Eosinophils Absolute: 0.1 10*3/uL (ref 0.0–0.7)
HCT: 39.1 % (ref 36.0–46.0)
Hemoglobin: 13 g/dL (ref 12.0–15.0)
LYMPHS ABS: 1.8 10*3/uL (ref 0.7–4.0)
Lymphocytes Relative: 35.2 % (ref 12.0–46.0)
MCHC: 33.3 g/dL (ref 30.0–36.0)
MCV: 92.2 fl (ref 78.0–100.0)
MONOS PCT: 9.4 % (ref 3.0–12.0)
Monocytes Absolute: 0.5 10*3/uL (ref 0.1–1.0)
Neutro Abs: 2.7 10*3/uL (ref 1.4–7.7)
Neutrophils Relative %: 52.3 % (ref 43.0–77.0)
Platelets: 252 10*3/uL (ref 150.0–400.0)
RBC: 4.24 Mil/uL (ref 3.87–5.11)
RDW: 12.5 % (ref 11.5–15.5)
WBC: 5.1 10*3/uL (ref 4.0–10.5)

## 2015-08-08 LAB — LIPID PANEL
CHOL/HDL RATIO: 3
Cholesterol: 233 mg/dL — ABNORMAL HIGH (ref 0–200)
HDL: 90.4 mg/dL (ref >39.00)
LDL CALC: 133 mg/dL — AB (ref 0–99)
NonHDL: 142.93
TRIGLYCERIDES: 51 mg/dL (ref 0.0–149.0)
VLDL: 10.2 mg/dL (ref 0.0–40.0)

## 2015-08-08 LAB — TSH: TSH: 1.37 u[IU]/mL (ref 0.35–4.50)

## 2015-08-08 LAB — VITAMIN D 25 HYDROXY (VIT D DEFICIENCY, FRACTURES): VITD: 21.43 ng/mL — ABNORMAL LOW (ref 30.00–100.00)

## 2015-08-08 NOTE — Assessment & Plan Note (Signed)
Very mild.  Pt is exercising regularly but not as much as previously.  Check labs to risk stratify.  Encouraged healthy diet and regular exercise.  Will follow.

## 2015-08-08 NOTE — Progress Notes (Signed)
Pre visit review using our clinic review tool, if applicable. No additional management support is needed unless otherwise documented below in the visit note. 

## 2015-08-08 NOTE — Assessment & Plan Note (Signed)
UTD on DEXA- due 2017.  Check Vit D.  Replete prn.

## 2015-08-08 NOTE — Assessment & Plan Note (Signed)
Pt's PE WNL.  UTD on mammo and DEXA.  Due for colonoscopy (she has never had)- order entered.  Written screening schedule updated and given to pt.  Flu shot and pneumovax given.  Check labs.  Anticipatory guidance provided.

## 2015-08-08 NOTE — Progress Notes (Signed)
   Subjective:    Patient ID: Negin Hegg, female    DOB: 11-14-1945, 69 y.o.   MRN: [id number]  HPI Here today for CPE.  Risk Factors: Hyperlipidemia- chronic problem.  Attempting to control w/ healthy diet and regular exercise. Overweight- pt's BMI is 26.  She is exercising regularly, although not as much as previously. Physical Activity: exercising regularly, enjoys walking Fall Risk: low Depression: denies Hearing: normal to conversational tones and whispered voice at 6 ft ADL's: independent Cognitive: normal linear thought process, memory and attention intact Home Safety: safe at home Height, Weight, BMI, Visual Acuity: see vitals, vision corrected to 20/20 w/ glasses Counseling: due for colonoscopy- pt has never had, mammo- due in Nov.  Pt to get flu and pneumovax Care team reviewed and updated w/ pt Labs Ordered: See A&P Care Plan: See A&P    Review of Systems Patient reports no vision/ hearing changes, adenopathy,fever, weight change,  persistant/recurrent hoarseness , swallowing issues, chest pain, palpitations, edema, persistant/recurrent cough, hemoptysis, dyspnea (rest/exertional/paroxysmal nocturnal), gastrointestinal bleeding (melena, rectal bleeding), abdominal pain, significant heartburn, bowel changes, GU symptoms (dysuria, hematuria, incontinence), Gyn symptoms (abnormal  bleeding, pain),  syncope, focal weakness, memory loss, numbness & tingling, skin/hair/nail changes, abnormal bruising or bleeding, anxiety, or depression.     Objective:   Physical Exam General Appearance:    Alert, cooperative, no distress, appears stated age  Head:    Normocephalic, without obvious abnormality, atraumatic  Eyes:    PERRL, conjunctiva/corneas clear, EOM's intact, fundi    benign, both eyes  Ears:    Normal TM's and external ear canals, both ears  Nose:   Nares normal, septum midline, mucosa normal, no drainage    or sinus tenderness  Throat:   Lips, mucosa, and tongue  normal; teeth and gums normal  Neck:   Supple, symmetrical, trachea midline, no adenopathy;    Thyroid: no enlargement/tenderness/nodules  Back:     Symmetric, no curvature, ROM normal, no CVA tenderness  Lungs:     Clear to auscultation bilaterally, respirations unlabored  Chest Wall:    No tenderness or deformity   Heart:    Regular rate and rhythm, S1 and S2 normal, no murmur, rub   or gallop  Breast Exam:    Deferred to mammo  Abdomen:     Soft, non-tender, bowel sounds active all four quadrants,    no masses, no organomegaly  Genitalia:    Deferred  Rectal:    Extremities:   Extremities normal, atraumatic, no cyanosis or edema  Pulses:   2+ and symmetric all extremities  Skin:   Skin color, texture, turgor normal, no rashes or lesions  Lymph nodes:   Cervical, supraclavicular, and axillary nodes normal  Neurologic:   CNII-XII intact, normal strength, sensation and reflexes    throughout          Assessment & Plan:

## 2015-08-08 NOTE — Assessment & Plan Note (Signed)
Ongoing issue for pt.  She is attempting to control w/ healthy diet and regular exercise.  She has never been on meds.  Check labs.  Start meds prn.

## 2015-08-08 NOTE — Patient Instructions (Signed)
Follow up in 6 months to recheck cholesterol We'll notify you of your lab results and make any changes if needed Keep up the good work on healthy diet and regular exercise- you look great! Schedule your mammogram when you get your reminder in the mail We'll call you with your GI appt for the colonoscopy consultation Call with any questions or concerns If you want to join [place] at the new [employer] office, any scheduled appointments will automatically transfer and we will see you at [address] [place] [address] [place], [place] [address]  Happy Fall!!!

## 2015-08-09 ENCOUNTER — Other Ambulatory Visit: Payer: Self-pay | Admitting: General Practice

## 2015-08-09 MED ORDER — VITAMIN D (ERGOCALCIFEROL) 1.25 MG (50000 UNIT) PO CAPS: 50000.0000 [IU] | ORAL_CAPSULE | ORAL | Status: DC

## 2015-08-15 ENCOUNTER — Encounter: Payer: Self-pay | Admitting: Internal Medicine

## 2015-09-21 ENCOUNTER — Ambulatory Visit (AMBULATORY_SURGERY_CENTER): Payer: Self-pay

## 2015-09-21 VITALS — Ht 62.0 in | Wt 143.2 lb

## 2015-09-21 DIAGNOSIS — Z1211 Encounter for screening for malignant neoplasm of colon: Secondary | ICD-10-CM

## 2015-09-21 MED ORDER — SUPREP BOWEL PREP KIT 17.5-3.13-1.6 GM/177ML PO SOLN: 1.0000 | Freq: Once | ORAL | Status: DC

## 2015-09-21 NOTE — Progress Notes (Signed)
No allergies to eggs or soy No past problems with anesthesia except "it takes longer to take effect and then it lasts longer on me than most people" No diet/weight loss meds No home oxygen  Has email and internet; registered for emmi

## 2015-09-22 ENCOUNTER — Encounter: Payer: Self-pay | Admitting: Internal Medicine

## 2015-10-03 ENCOUNTER — Other Ambulatory Visit: Payer: Self-pay | Admitting: General Practice

## 2015-10-03 MED ORDER — VITAMIN D (ERGOCALCIFEROL) 1.25 MG (50000 UNIT) PO CAPS: 50000.0000 [IU] | ORAL_CAPSULE | ORAL | Status: DC

## 2015-10-05 ENCOUNTER — Encounter: Payer: Self-pay | Admitting: Internal Medicine

## 2015-10-05 ENCOUNTER — Ambulatory Visit (AMBULATORY_SURGERY_CENTER): Payer: Medicare PPO | Admitting: Internal Medicine

## 2015-10-05 VITALS — BP 125/71 | HR 74 | Temp 97.5°F | Resp 25 | Ht 62.0 in | Wt 143.0 lb

## 2015-10-05 DIAGNOSIS — D120 Benign neoplasm of cecum: Secondary | ICD-10-CM | POA: Diagnosis not present

## 2015-10-05 DIAGNOSIS — Z1211 Encounter for screening for malignant neoplasm of colon: Secondary | ICD-10-CM

## 2015-10-05 DIAGNOSIS — E669 Obesity, unspecified: Secondary | ICD-10-CM | POA: Diagnosis not present

## 2015-10-05 MED ORDER — SODIUM CHLORIDE 0.9 % IV SOLN: 500.0000 mL | INTRAVENOUS | Status: DC

## 2015-10-05 NOTE — Progress Notes (Signed)
Called to room to assist during endoscopic procedure.  Patient ID and intended procedure confirmed with present staff. Received instructions for my participation in the procedure from the performing physician.  

## 2015-10-05 NOTE — Progress Notes (Signed)
Report to PACU, RN, vss, BBS= Clear.  

## 2015-10-05 NOTE — Patient Instructions (Signed)
Discharge instructions given. Handout on polyps. Resume previous medications. YOU HAD AN ENDOSCOPIC PROCEDURE TODAY AT THE [facility]:   Refer to the procedure report that was given to you for any specific questions about what was found during the examination.  If the procedure report does not answer your questions, please call your gastroenterologist to clarify.  If you requested that your care partner not be given the details of your procedure findings, then the procedure report has been included in a sealed envelope for you to review at your convenience later.  YOU SHOULD EXPECT: Some feelings of bloating in the abdomen. Passage of more gas than usual.  Walking can help get rid of the air that was put into your GI tract during the procedure and reduce the bloating. If you had a lower endoscopy (such as a colonoscopy or flexible sigmoidoscopy) you may notice spotting of blood in your stool or on the toilet paper. If you underwent a bowel prep for your procedure, you may not have a normal bowel movement for a few days.  Please Note:  You might notice some irritation and congestion in your nose or some drainage.  This is from the oxygen used during your procedure.  There is no need for concern and it should clear up in a day or so.  SYMPTOMS TO REPORT IMMEDIATELY:   Following lower endoscopy (colonoscopy or flexible sigmoidoscopy):  Excessive amounts of blood in the stool  Significant tenderness or worsening of abdominal pains  Swelling of the abdomen that is new, acute  Fever of 100F or higher   For urgent or emergent issues, a gastroenterologist can be reached at any hour by calling [phone].   DIET: Your first meal following the procedure should be a small meal and then it is ok to progress to your normal diet. Heavy or fried foods are harder to digest and may make you feel nauseous or bloated.  Likewise, meals heavy in dairy and vegetables can increase bloating.  Drink  plenty of fluids but you should avoid alcoholic beverages for 24 hours.  ACTIVITY:  You should plan to take it easy for the rest of today and you should NOT DRIVE or use heavy machinery until tomorrow (because of the sedation medicines used during the test).    FOLLOW UP: Our staff will call the number listed on your records the next business day following your procedure to check on you and address any questions or concerns that you may have regarding the information given to you following your procedure. If we do not reach you, we will leave a message.  However, if you are feeling well and you are not experiencing any problems, there is no need to return our call.  We will assume that you have returned to your regular daily activities without incident.  If any biopsies were taken you will be contacted by phone or by letter within the next 1-3 weeks.  Please call us at [phone] if you have not heard about the biopsies in 3 weeks.    SIGNATURES/CONFIDENTIALITY: You and/or your care partner have signed paperwork which will be entered into your electronic medical record.  These signatures attest to the fact that that the information above on your After Visit Summary has been reviewed and is understood.  Full responsibility of the confidentiality of this discharge information lies with you and/or your care-partner. 

## 2015-10-06 ENCOUNTER — Telehealth: Payer: Self-pay | Admitting: *Deleted

## 2015-10-06 NOTE — Op Note (Signed)
[place]  [address] [place], [address]   COLONOSCOPY PROCEDURE REPORT  PATIENT: Marilyn Hess, Marilyn Hess  MR#: [id number] BIRTHDATE: 11-09-1945 , 21  yrs. old GENDER: female ENDOSCOPIST: Eustace Quail, MD REFERRED [id number] Wadie Lessen, M.D. PROCEDURE DATE:  10/05/2015 PROCEDURE:   Colonoscopy, screening and Colonoscopy with snare polypectomy x 1 First Screening Colonoscopy - Avg.  risk and is 50 yrs.  old or older Yes.  Prior Negative Screening - Now for repeat screening. N/A  History of Adenoma - Now for follow-up colonoscopy & has been > or = to 3 yrs.  N/A  Polyps removed today? Yes ASA CLASS:   Class I INDICATIONS:Screening for colonic neoplasia and Colorectal Neoplasm Risk Assessment for this procedure is average risk. MEDICATIONS: Monitored anesthesia care and Propofol 200 mg IV  DESCRIPTION OF PROCEDURE:   After the risks benefits and alternatives of the procedure were thoroughly explained, informed consent was obtained.  The digital rectal exam revealed no abnormalities of the rectum.   The LB [id number]  endoscope was introduced through the anus and advanced to the cecum, which was identified by both the appendix and ileocecal valve. No adverse events experienced.   The quality of the prep was excellent. (Suprep was used)  The instrument was then slowly withdrawn as the colon was fully examined. Estimated blood loss is zero unless otherwise noted in this procedure report.    COLON FINDINGS: A single polyp measuring 3 mm in size was found at the cecum.  A polypectomy was performed with a cold snare.  The resection was complete, the polyp tissue was completely retrieved and sent to histology.   The examination was otherwise normal. Retroflexed views revealed internal hemorrhoids. The time to cecum = 4.3 Withdrawal time = 9.4   The scope was withdrawn and the procedure completed. COMPLICATIONS: There were no immediate  complications.  ENDOSCOPIC IMPRESSION: 1.   Single polyp was found at the cecum; polypectomy was performed with a cold snare 2.   The examination was otherwise normal  RECOMMENDATIONS: 1. Repeat colonoscopy in 5 years if polyp adenomatous; otherwise 10 years  eSigned:  Eustace Quail, MD 10/05/2015 4:16 PM   cc: The Patient and Midge Minium, MD

## 2015-10-06 NOTE — Telephone Encounter (Signed)
  Follow up Call-  Call back number 10/05/2015  Post procedure Call Back phone  # [phone]  Permission to leave phone message Yes   [facility]

## 2015-10-11 ENCOUNTER — Encounter: Payer: Self-pay | Admitting: Internal Medicine

## 2015-11-15 ENCOUNTER — Telehealth: Payer: Self-pay | Admitting: Family Medicine

## 2015-11-15 NOTE — Telephone Encounter (Signed)
Caller name: [name] Relation to pt: self Call back number: [phone] Pharmacy: [facility]  Reason for call: Pt is requesting rx for metroNIDAZOLE (METROGEL) 1 % gel. Please advise.

## 2015-11-15 NOTE — Telephone Encounter (Signed)
Called patient to see what type symptoms she was having.Left message for return call.[name] Kitchen

## 2015-11-16 ENCOUNTER — Other Ambulatory Visit: Payer: Self-pay

## 2015-11-16 MED ORDER — METRONIDAZOLE 1 % EX GEL: 1.0000 "application " | Freq: Every day | CUTANEOUS | Status: DC

## 2015-11-16 NOTE — Telephone Encounter (Signed)
Tye for refill on [employer] w/ 6 additional refills

## 2015-11-16 NOTE — Telephone Encounter (Signed)
Medication refilled - patient notified.

## 2015-11-16 NOTE — Telephone Encounter (Signed)
2-3 red bumps on nose, reddening between eyes, and cheeks are splotchy.

## 2015-11-16 NOTE — Telephone Encounter (Signed)
Medication refilled per patient request.  

## 2015-11-16 NOTE — Telephone Encounter (Signed)
Patient states she was given Metrogel by Dermatologist. Needs new RX would like to know if you would prescribe. Has Rosaesia flare up. Does not want to go back to Dermatologist. Can call his office if need be.

## 2016-05-23 ENCOUNTER — Telehealth: Payer: Self-pay | Admitting: Family Medicine

## 2016-05-23 NOTE — Telephone Encounter (Signed)
Fine by me 

## 2016-05-23 NOTE — Telephone Encounter (Signed)
I certainly understand!

## 2016-05-23 NOTE — Telephone Encounter (Signed)
Pt called stating she lives 10 minutes from [place] office and will not be able to go to [place]. She requested appt with Dr. Lorelei Pont moving forward.

## 2016-07-05 ENCOUNTER — Ambulatory Visit (INDEPENDENT_AMBULATORY_CARE_PROVIDER_SITE_OTHER): Payer: Medicare Other | Admitting: Family Medicine

## 2016-07-05 ENCOUNTER — Encounter: Payer: Self-pay | Admitting: Family Medicine

## 2016-07-05 ENCOUNTER — Other Ambulatory Visit (HOSPITAL_COMMUNITY)
Admission: RE | Admit: 2016-07-05 | Discharge: 2016-07-05 | Disposition: A | Discharge status: Home / Self Care | Payer: Medicare Other | Source: Ambulatory Visit | Attending: Family Medicine | Admitting: Family Medicine

## 2016-07-05 VITALS — BP 117/75 | HR 77 | Temp 98.1°F | Ht 62.0 in | Wt 138.0 lb

## 2016-07-05 DIAGNOSIS — Z0000 Encounter for general adult medical examination without abnormal findings: Secondary | ICD-10-CM | POA: Diagnosis not present

## 2016-07-05 DIAGNOSIS — Z1151 Encounter for screening for human papillomavirus (HPV): Secondary | ICD-10-CM | POA: Insufficient documentation

## 2016-07-05 DIAGNOSIS — Z130 Encounter for screening for diseases of the blood and blood-forming organs and certain disorders involving the immune mechanism: Secondary | ICD-10-CM

## 2016-07-05 DIAGNOSIS — Z01419 Encounter for gynecological examination (general) (routine) without abnormal findings: Secondary | ICD-10-CM | POA: Insufficient documentation

## 2016-07-05 DIAGNOSIS — E785 Hyperlipidemia, unspecified: Secondary | ICD-10-CM | POA: Diagnosis not present

## 2016-07-05 DIAGNOSIS — Z131 Encounter for screening for diabetes mellitus: Secondary | ICD-10-CM | POA: Diagnosis not present

## 2016-07-05 DIAGNOSIS — R059 Cough, unspecified: Secondary | ICD-10-CM

## 2016-07-05 DIAGNOSIS — Z1329 Encounter for screening for other suspected endocrine disorder: Secondary | ICD-10-CM | POA: Diagnosis not present

## 2016-07-05 DIAGNOSIS — Z124 Encounter for screening for malignant neoplasm of cervix: Secondary | ICD-10-CM

## 2016-07-05 DIAGNOSIS — E559 Vitamin D deficiency, unspecified: Secondary | ICD-10-CM | POA: Diagnosis not present

## 2016-07-05 DIAGNOSIS — R05 Cough: Secondary | ICD-10-CM

## 2016-07-05 LAB — CBC
HEMATOCRIT: 38.9 % (ref 36.0–46.0)
HEMOGLOBIN: 13.1 g/dL (ref 12.0–15.0)
MCHC: 33.7 g/dL (ref 30.0–36.0)
MCV: 90.8 fl (ref 78.0–100.0)
PLATELETS: 370 10*3/uL (ref 150.0–400.0)
RBC: 4.28 Mil/uL (ref 3.87–5.11)
RDW: 11.8 % (ref 11.5–15.5)
WBC: 8.1 10*3/uL (ref 4.0–10.5)

## 2016-07-05 LAB — COMPREHENSIVE METABOLIC PANEL
ALK PHOS: 64 U/L (ref 39–117)
ALT: 13 U/L (ref 0–35)
AST: 16 U/L (ref 0–37)
Albumin: 4 g/dL (ref 3.5–5.2)
BILIRUBIN TOTAL: 0.4 mg/dL (ref 0.2–1.2)
BUN: 19 mg/dL (ref 6–23)
CO2: 30 mEq/L (ref 19–32)
Calcium: 9.1 mg/dL (ref 8.4–10.5)
Chloride: 103 mEq/L (ref 96–112)
Creatinine, Ser: 0.9 mg/dL (ref 0.40–1.20)
GFR: 66.18 mL/min (ref >60.00)
Glucose, Bld: 96 mg/dL (ref 70–99)
Potassium: 4 mEq/L (ref 3.5–5.1)
Sodium: 139 mEq/L (ref 135–145)
TOTAL PROTEIN: 6.7 g/dL (ref 6.0–8.3)

## 2016-07-05 LAB — TSH: TSH: 1.71 u[IU]/mL (ref 0.35–4.50)

## 2016-07-05 LAB — LIPID PANEL
Cholesterol: 201 mg/dL — ABNORMAL HIGH (ref 0–200)
HDL: 66.2 mg/dL (ref >39.00)
LDL Cholesterol: 124 mg/dL — ABNORMAL HIGH (ref 0–99)
NONHDL: 134.95
Total CHOL/HDL Ratio: 3
Triglycerides: 53 mg/dL (ref 0.0–149.0)
VLDL: 10.6 mg/dL (ref 0.0–40.0)

## 2016-07-05 LAB — VITAMIN D 25 HYDROXY (VIT D DEFICIENCY, FRACTURES): VITD: 29.35 ng/mL — ABNORMAL LOW (ref 30.00–100.00)

## 2016-07-05 MED ORDER — HYDROCODONE-HOMATROPINE 5-1.5 MG/5ML PO SYRP: 5.0000 mL | ORAL_SOLUTION | Freq: Three times a day (TID) | ORAL | 0 refills | Status: DC | PRN

## 2016-07-05 NOTE — Patient Instructions (Addendum)
It was very nice to see you today!  Do be sure to get a flu shot later this fall and a mammogram this year. Try the cough syrup as needed before bed - it will make you sleepy so do not use it if you have to drive. Let me know if your cough is not improving or if you have any other symptoms such as fever Assuming your pap is normal you do not have to continue to get pap smears  If you do want to see someone about your knee pain just let me know and I can make a referral for you

## 2016-07-05 NOTE — Progress Notes (Signed)
[place] at [facility] [address], [place], [place] [address] [phone]  Date:  07/05/2016   Name:  Marilyn Hess   DOB:  [birth year]   MRN:  [id number]  PCP:  Annye Asa, MD    Chief Complaint: Annual Exam (Last mammo: 08/2014, Last PAP: 2012, Last Colonoscopy: 09/2015. )   History of Present Illness:  Kimberlyanne Choudhury is a 70 y.o. very pleasant female patient who presents with the following:  Here today for her annual CPE. Last seen here about a year ago for same.    Hyperlipidemia- chronic problem.  Attempting to control w/ healthy diet and regular exercise. Overweight- pt's BMI is 26.  She is exercising regularly, although not as much as previously. Physical Activity: exercising regularly, enjoys walking I am taking over for her prior PCP, Dr Birdie Riddle,  who has moved to a new location She is fasting this am for labs  Her last pap was about 5 years ago, never had an abnl She gets mammo every 2 years  Her shots are all UTD excpet for her flu shot. She would prefer to wait on this as she is a bit ill today, she can have this done at [facility] for free  She has been mildly ill with a cough for about 2 weeks.  She is using an OTC nyquil at night but is still not sleeping well.   No fevers noted.  States that she often gets this sort of symptoms a couple of times a year and it will resolve without incident. Her only real concern is that she cannot sleep due to cough.  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    She has not been walking as much due to a left knee injury. 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  Hyperlipidemia 02/07/2015  . Overweight (BMI 25.0-29.9) 08/05/2014  . Osteopenia 07/10/2012  . Physical exam, annual 07/10/2012  . ROSACEA 12/22/2010  . CARPAL TUNNEL SYNDROME 10/20/2007  . 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 LACTOSE INTOLERANCE 01/16/2007  . BREAST MASS, BENIGN 01/16/2007    Past Medical History:  Diagnosis Date  . Breast lump   . Hyperlipidemia     Past Surgical History:  Procedure Laterality Date  . CARPAL TUNNEL RELEASE  11-2014  . TOOTH EXTRACTION  07-2011    Social History  Substance Use Topics  . Smoking status: Never Smoker  . Smokeless tobacco: Never Used  . Alcohol use No    Family History  Problem Relation Age of Onset  . Alzheimer's disease Mother   . Stroke Father   . Alcohol abuse Father   . Colon cancer Neg Hx     Allergies  Allergen Reactions  . Cinnamon     Headache, breathing problem to long exposure    Medication list has been reviewed and updated.  Current Outpatient Prescriptions on File Prior to Visit  Medication Sig Dispense Refill  . Ascorbic Acid (VITAMIN C PO) Take by mouth. Reported on 10/05/2015    . calcium carbonate (OS-CAL - DOSED IN MG OF ELEMENTAL CALCIUM) 1250 (500  CA) MG tablet Take 1 tablet by mouth. Reported on 10/05/2015    . metroNIDAZOLE (METROGEL) 1 % gel Apply 1 application topically daily. Reported on 10/05/2015 45 g 6  . Vitamin D, Ergocalciferol, (DRISDOL) 50000 UNITS CAPS capsule Take 1 capsule (50,000 Units total) by mouth every 7 (seven) days. 12 capsule 0   No current facility-administered medications on file prior to visit.     Review of Systems:  As per HPI- otherwise negative.   Physical Examination: Vitals:   07/05/16 0841 07/05/16 0845  BP: (!) 142/73 117/75  Pulse: 77 77  Temp: 98.1 F (36.7 C) 98.1 F (36.7 C)   Vitals:   07/05/16 0845  Weight: 138 lb (62.6 kg)  Height: 5\' 2"  (1.575 m)   Body mass index is 25.24 kg/m. Ideal Body Weight: Weight in (lb) to have BMI = 25:  136.4  GEN: WDWN, NAD, Non-toxic, A & O x 3, normal weight, looks well HEENT: Atraumatic, Normocephalic. Neck supple. No masses, No LAD.  Bilateral TM wnl, oropharynx normal.  PEERL,EOMI.   Ears and Nose: No external deformity. CV: RRR, No M/G/R. No JVD. No thrill. No extra heart sounds. PULM: CTA B, no wheezes, crackles, rhonchi. No retractions. No resp. distress. No accessory muscle use. ABD: S, NT, ND. No rebound. No HSM. EXTR: No c/c/e NEURO Normal gait.  PSYCH: Normally interactive. Conversant. Not depressed or anxious appearing.  Calm demeanor.  Breast: normal exam, no masses/ dimpling/ discharge Pelvic: normal, no vaginal lesions or discharge. Uterus normal, no CMT, no adnexal tendereness or masses  Assessment and Plan: Physical exam  Screening for cervical cancer - Plan: Cytology - PAP  Screening for diabetes mellitus - Plan: Comprehensive metabolic panel  Vitamin D deficiency - Plan: Vitamin D (25 hydroxy)  Screening for thyroid disorder - Plan: TSH  Screening for deficiency anemia - Plan: CBC  Dyslipidemia - Plan: Lipid panel  Cough - Plan: HYDROcodone-homatropine (HYCODAN) 5-1.5 MG/5ML syrup  CPE today, labs pending. Cautioned regarding sedation with rx cough medication- she states understanding   It was very nice to see you today!  Do be sure to get a flu shot later this fall and a mammogram this year. Try the cough syrup as needed before bed - it will make you sleepy so do not use it if you have to drive. Let me know if your cough is not improving or if you have any other symptoms such as fever Assuming your pap is normal you do not have to continue to get pap smears  If you do want to see someone about your knee pain just let me know and I can make a referral for you  Signed Lamar Blinks, MD

## 2016-07-06 ENCOUNTER — Encounter: Payer: Self-pay | Admitting: Family Medicine

## 2016-07-06 LAB — CYTOLOGY - PAP

## 2016-10-11 ENCOUNTER — Telehealth: Payer: Self-pay | Admitting: Family Medicine

## 2016-10-11 NOTE — Telephone Encounter (Signed)
Caller name: Relationship to patient:Self Can be reached: [phone] Pharmacy:  Reason for call: Patient wants to know if she can donate blood. States that with her heart valve problem she has been told she can not donate.

## 2016-10-11 NOTE — Telephone Encounter (Signed)
If she was told that she cannot donate blood I would defer to this advice; I am not sure why she was told tihs

## 2016-10-16 NOTE — Telephone Encounter (Signed)
Tried to contact pt to discuss provider recommendation regarding pt's blood donation question. No answer, left message with this information for pt.

## 2017-01-30 ENCOUNTER — Telehealth: Payer: Self-pay | Admitting: Family Medicine

## 2017-01-30 DIAGNOSIS — Z1239 Encounter for other screening for malignant neoplasm of breast: Secondary | ICD-10-CM

## 2017-01-30 DIAGNOSIS — L719 Rosacea, unspecified: Secondary | ICD-10-CM

## 2017-01-30 NOTE — Telephone Encounter (Signed)
°  Relation to pt: self Call back number:[phone] Pharmacy:  Reason for call:  Patient requesting annual mamo orders please place at the [facility] in [place].  Patient requesting referral Dr. Renda Rolls dermatology due to her annual check regarding her moles and facial condition rosacea, please advise

## 2017-01-30 NOTE — Telephone Encounter (Signed)
I placed both of these order for her. Please call her and let her know- ok to [facility] with this info.  Thank you!

## 2017-01-31 ENCOUNTER — Other Ambulatory Visit: Payer: Self-pay | Admitting: Family Medicine

## 2017-01-31 DIAGNOSIS — Z1231 Encounter for screening mammogram for malignant neoplasm of breast: Secondary | ICD-10-CM

## 2017-01-31 NOTE — Telephone Encounter (Signed)
Pt informed

## 2017-02-01 ENCOUNTER — Ambulatory Visit
Admission: RE | Admit: 2017-02-01 | Discharge: 2017-02-01 | Disposition: A | Discharge status: Home / Self Care | Payer: Medicare Other | Source: Ambulatory Visit | Attending: Family Medicine | Admitting: Family Medicine

## 2017-02-01 DIAGNOSIS — Z1231 Encounter for screening mammogram for malignant neoplasm of breast: Secondary | ICD-10-CM

## 2017-04-04 ENCOUNTER — Telehealth: Payer: Self-pay | Admitting: *Deleted

## 2017-04-04 NOTE — Telephone Encounter (Signed)
Changed AWV to 04/08/17 @9 

## 2017-04-05 NOTE — Progress Notes (Addendum)
Subjective:   Marilyn Hess is a 71 y.o. female who presents for an Initial Medicare Annual Wellness Visit.  Review of Systems    No ROS.  Medicare Wellness Visit. Additional risk factors are reflected in the social history.  Cardiac Risk Factors include: advanced age (>57men, >73 women);dyslipidemia Sleep patterns:   Doesn't sleep well but feels rested. Relaxation methods and melatonin discussed. Naps occasionally. Home Safety/Smoke Alarms: Feels safe in home. Smoke alarms in place.  Living environment; residence and Firearm Safety:  Lives alone.No guns. No stairs. Seat Belt Safety/Bike Helmet: Wears seat belt.   Counseling:   Eye Exam- pt states she doesn't have optometrist, but will schedule visit soon. Dental- Dr.Jones as needed.   Female:   Pap- last 07/05/16:  normal     Mammo- last 02/01/17: BI-RADS CATEGORY  1: Negative.       Dexa scan- Last 08/24/14:  Osteopenia. Pt declines scheduling today. CCS- Last 10/05/15: pre-cancerous polyp removed. Recall 5 yrs.    Objective:    Today's Vitals   04/08/17 0920  BP: 118/62  Pulse: 70  SpO2: 98%  Weight: 145 lb 8 oz (66 kg)  Height: 5\' 2"  (1.575 m)   Body mass index is 26.61 kg/m.   Current Medications (verified) Outpatient Encounter Prescriptions as of 04/08/2017  Medication Sig  . aspirin EC 81 MG tablet Take 81 mg by mouth daily.  . calcium carbonate (OS-CAL - DOSED IN MG OF ELEMENTAL CALCIUM) 1250 (500 CA) MG tablet Take 1 tablet by mouth. Reported on 10/05/2015  . metroNIDAZOLE (METROGEL) 1 % gel Apply 1 application topically daily. Reported on 10/05/2015  . Vitamin D, Ergocalciferol, (DRISDOL) 50000 UNITS CAPS capsule Take 1 capsule (50,000 Units total) by mouth every 7 (seven) days.  . Ascorbic Acid (VITAMIN C PO) Take by mouth. Reported on 10/05/2015  . [DISCONTINUED] HYDROcodone-homatropine (HYCODAN) 5-1.5 MG/5ML syrup Take 5 mLs by mouth every 8 (eight) hours as needed for cough.   No facility-administered  encounter medications on file as of 04/08/2017.     Allergies (verified) Cinnamon   History: Past Medical History:  Diagnosis Date  . Breast lump   . Hyperlipidemia    Past Surgical History:  Procedure Laterality Date  . CARPAL TUNNEL RELEASE  11-2014  . TOOTH EXTRACTION  07-2011   Family History  Problem Relation Age of Onset  . Adopted: Yes  . Stroke Father   . Alcohol abuse Father   . Hypertension Father   . Alzheimer's disease Mother   . Hypertension Brother   . Colon cancer Neg Hx    Social History   Occupational History  . Not on file.   Social History Main Topics  . Smoking status: Never Smoker  . Smokeless tobacco: Never Used  . Alcohol use No  . Drug use: No  . Sexual activity: No    Tobacco Counseling Counseling given: No   Activities of Daily Living In your present state of health, do you have any difficulty performing the following activities: 04/08/2017  Hearing? N  Vision? N  Difficulty concentrating or making decisions? N  Walking or climbing stairs? N  Dressing or bathing? N  Doing errands, shopping? N  Preparing Food and eating ? N  Using the Toilet? N  In the past six months, have you accidently leaked urine? Y  Do you have problems with loss of bowel control? N  Managing your Medications? N  Managing your Finances? N  Housekeeping or managing your Housekeeping? N  Some recent data might be hidden    Immunizations and Health Maintenance Immunization History  Administered Date(s) Administered  . Influenza,inj,Quad PF,36+ Mos 08/05/2013, 08/08/2015  . Pneumococcal Conjugate-13 08/05/2014  . Pneumococcal Polysaccharide-23 08/08/2015  . Tdap 06/07/2015  . Zoster 02/21/2016   Health Maintenance Due  Topic Date Due  . Hepatitis C Screening  07-07-1946    Patient Care Team: Copland, Gay Filler, MD as PCP - General (Family Medicine) Haverstock, Jennefer Bravo, MD as Referring Physician (Dermatology)  Indicate any recent Medical  Services you may have received from other than [facility] providers in the past year (date may be approximate).     Assessment:   This is a routine wellness examination for Mikaiah. Physical assessment deferred to PCP.   Hearing/Vision screen No exam data present  Dietary issues and exercise activities discussed: Current Exercise Habits: Home exercise routine, Type of exercise: walking, Time (Minutes): 45, Frequency (Times/Week): 5, Weekly Exercise (Minutes/Week): 225, Intensity: Moderate   Diet (meal preparation, eat out, water intake, caffeinated beverages, dairy products, fruits and vegetables): in general, a "healthy" diet    Goals      Patient Stated   . Begin oraganizing home (pt-stated)    . Continue exercising (pt-stated)      Depression Screen PHQ 2/9 Scores 04/08/2017 08/08/2015 08/05/2014  PHQ - 2 Score 0 0 0    Fall Risk Fall Risk  04/08/2017 08/08/2015 08/05/2014  Falls in the past year? No No No    Cognitive Function: Ad8 score reviewed for issues:  Issues making decisions:no  Less interest in hobbies / activities:no  Repeats questions, stories (family complaining):no  Trouble using ordinary gadgets (microwave, computer, phone):no  Forgets the month or year: no  Mismanaging finances: no  Remembering appts:no  Daily problems with thinking and/or memory:no Ad8 score is=0        Screening Tests Health Maintenance  Topic Date Due  . Hepatitis C Screening  Mar 26, 1946  . INFLUENZA VACCINE  05/22/2017  . MAMMOGRAM  02/01/2018  . COLONOSCOPY  10/04/2020  . TETANUS/TDAP  06/06/2025  . DEXA SCAN  Completed  . PNA vac Low Risk Adult  Completed      Plan:    Follow up with Dr.Copland 07/08/17.   Consider your bone density scan.  Continue to eat heart healthy diet (full of fruits, vegetables, whole grains, lean protein, water--limit salt, fat, and sugar intake) and increase physical activity as tolerated.  Continue doing brain stimulating  activities (puzzles, reading, adult coloring books, staying active) to keep memory sharp.     I have personally reviewed and noted the following in the patient's chart:   . Medical and social history . Use of alcohol, tobacco or illicit drugs  . Current medications and supplements . Functional ability and status . Nutritional status . Physical activity . Advanced directives . List of other physicians . Hospitalizations, surgeries, and ER visits in previous 12 months . Vitals . Screenings to include cognitive, depression, and falls . Referrals and appointments  In addition, I have reviewed and discussed with patient certain preventive protocols, quality metrics, and best practice recommendations. A written personalized care plan for preventive services as well as general preventive health recommendations were provided to patient.     Shela Nevin, [place]   04/08/2017     I have reviewed MWE note by Ms. Grae Leathers and agree with her documentation

## 2017-04-08 ENCOUNTER — Encounter: Payer: Self-pay | Admitting: *Deleted

## 2017-04-08 ENCOUNTER — Ambulatory Visit (INDEPENDENT_AMBULATORY_CARE_PROVIDER_SITE_OTHER): Payer: Medicare Other | Admitting: *Deleted

## 2017-04-08 VITALS — BP 118/62 | HR 70 | Ht 62.0 in | Wt 145.5 lb

## 2017-04-08 DIAGNOSIS — Z0000 Encounter for general adult medical examination without abnormal findings: Secondary | ICD-10-CM | POA: Diagnosis not present

## 2017-04-08 NOTE — Patient Instructions (Signed)
Marilyn Hess , Thank you for taking time to come for your Medicare Wellness Visit. I appreciate your ongoing commitment to your health goals. Please review the following plan we discussed and let me know if I can assist you in the future.   These are the goals we discussed: Goals      Patient Stated   . Begin oraganizing home (pt-stated)    . Continue exercising (pt-stated)       This is a list of the screening recommended for you and due dates:  Health Maintenance  Topic Date Due  .  Hepatitis C: One time screening is recommended by Center for Disease Control  (CDC) for  adults born from [birth year] through 1963.   [birth year]  . Flu Shot  05/22/2017  . Mammogram  02/01/2018  . Colon Cancer Screening  10/04/2020  . Tetanus Vaccine  06/06/2025  . DEXA scan (bone density measurement)  Completed  . Pneumonia vaccines  Completed       Follow up with Dr.Copland 07/08/17.   Consider your bone density scan.  Continue to eat heart healthy diet (full of fruits, vegetables, whole grains, lean protein, water--limit salt, fat, and sugar intake) and increase physical activity as tolerated.  Continue doing brain stimulating activities (puzzles, reading, adult coloring books, staying active) to keep memory sharp.    Health Maintenance for Postmenopausal Women Menopause is a normal process in which your reproductive ability comes to an end. This process happens gradually over a span of months to years, usually between the ages of 50 and 46. Menopause is complete when you have missed 12 consecutive menstrual periods. It is important to talk with your health care provider about some of the most common conditions that affect postmenopausal women, such as heart disease, cancer, and bone loss (osteoporosis). Adopting a healthy lifestyle and getting preventive care can help to promote your health and wellness. Those actions can also lower your chances of developing some of these common conditions. What should  I know about menopause? During menopause, you may experience a number of symptoms, such as:  Moderate-to-severe hot flashes.  Night sweats.  Decrease in sex drive.  Mood swings.  Headaches.  Tiredness.  Irritability.  Memory problems.  Insomnia.  Choosing to treat or not to treat menopausal changes is an individual decision that you make with your health care provider. What should I know about hormone replacement therapy and supplements? Hormone therapy products are effective for treating symptoms that are associated with menopause, such as hot flashes and night sweats. Hormone replacement carries certain risks, especially as you become older. If you are thinking about using estrogen or estrogen with progestin treatments, discuss the benefits and risks with your health care provider. What should I know about heart disease and stroke? Heart disease, heart attack, and stroke become more likely as you age. This may be due, in part, to the hormonal changes that your body experiences during menopause. These can affect how your body processes dietary fats, triglycerides, and cholesterol. Heart attack and stroke are both medical emergencies. There are many things that you can do to help prevent heart disease and stroke:  Have your blood pressure checked at least every 1-2 years. High blood pressure causes heart disease and increases the risk of stroke.  If you are 69-69 years old, ask your health care provider if you should take aspirin to prevent a heart attack or a stroke.  Do not use any tobacco products, including cigarettes, chewing tobacco, or  electronic cigarettes. If you need help quitting, ask your health care provider.  It is important to eat a healthy diet and maintain a healthy weight. ? Be sure to include plenty of vegetables, fruits, low-fat dairy products, and lean protein. ? Avoid eating foods that are high in solid fats, added sugars, or salt (sodium).  Get regular  exercise. This is one of the most important things that you can do for your health. ? Try to exercise for at least 150 minutes each week. The type of exercise that you do should increase your heart rate and make you sweat. This is known as moderate-intensity exercise. ? Try to do strengthening exercises at least twice each week. Do these in addition to the moderate-intensity exercise.  Know your numbers.Ask your health care provider to check your cholesterol and your blood glucose. Continue to have your blood tested as directed by your health care provider.  What should I know about cancer screening? There are several types of cancer. Take the following steps to reduce your risk and to catch any cancer development as early as possible. Breast Cancer  Practice breast self-awareness. ? This means understanding how your breasts normally appear and feel. ? It also means doing regular breast self-exams. Let your health care provider know about any changes, no matter how small.  If you are 14 or older, have a clinician do a breast exam (clinical breast exam or CBE) every year. Depending on your age, family history, and medical history, it may be recommended that you also have a yearly breast X-ray (mammogram).  If you have a family history of breast cancer, talk with your health care provider about genetic screening.  If you are at high risk for breast cancer, talk with your health care provider about having an MRI and a mammogram every year.  Breast cancer (BRCA) gene test is recommended for women who have family members with BRCA-related cancers. Results of the assessment will determine the need for genetic counseling and BRCA1 and for BRCA2 testing. BRCA-related cancers include these types: ? Breast. This occurs in males or females. ? Ovarian. ? Tubal. This may also be called fallopian tube cancer. ? Cancer of the abdominal or pelvic lining (peritoneal  cancer). ? Prostate. ? Pancreatic.  Cervical, Uterine, and Ovarian Cancer Your health care provider may recommend that you be screened regularly for cancer of the pelvic organs. These include your ovaries, uterus, and vagina. This screening involves a pelvic exam, which includes checking for microscopic changes to the surface of your cervix (Pap test).  For women ages 21-65, health care providers may recommend a pelvic exam and a Pap test every three years. For women ages 58-65, they may recommend the Pap test and pelvic exam, combined with testing for human papilloma virus (HPV), every five years. Some types of HPV increase your risk of cervical cancer. Testing for HPV may also be done on women of any age who have unclear Pap test results.  Other health care providers may not recommend any screening for nonpregnant women who are considered low risk for pelvic cancer and have no symptoms. Ask your health care provider if a screening pelvic exam is right for you.  If you have had past treatment for cervical cancer or a condition that could lead to cancer, you need Pap tests and screening for cancer for at least 20 years after your treatment. If Pap tests have been discontinued for you, your risk factors (such as having a new sexual  partner) need to be reassessed to determine if you should start having screenings again. Some women have medical problems that increase the chance of getting cervical cancer. In these cases, your health care provider may recommend that you have screening and Pap tests more often.  If you have a family history of uterine cancer or ovarian cancer, talk with your health care provider about genetic screening.  If you have vaginal bleeding after reaching menopause, tell your health care provider.  There are currently no reliable tests available to screen for ovarian cancer.  Lung Cancer Lung cancer screening is recommended for adults 1-3 years old who are at high risk for  lung cancer because of a history of smoking. A yearly low-dose CT scan of the lungs is recommended if you:  Currently smoke.  Have a history of at least 30 pack-years of smoking and you currently smoke or have quit within the past 15 years. A pack-year is smoking an average of one pack of cigarettes per day for one year.  Yearly screening should:  Continue until it has been 15 years since you quit.  Stop if you develop a health problem that would prevent you from having lung cancer treatment.  Colorectal Cancer  This type of cancer can be detected and can often be prevented.  Routine colorectal cancer screening usually begins at age 30 and continues through age 62.  If you have risk factors for colon cancer, your health care provider may recommend that you be screened at an earlier age.  If you have a family history of colorectal cancer, talk with your health care provider about genetic screening.  Your health care provider may also recommend using home test kits to check for hidden blood in your stool.  A small camera at the end of a tube can be used to examine your colon directly (sigmoidoscopy or colonoscopy). This is done to check for the earliest forms of colorectal cancer.  Direct examination of the colon should be repeated every 5-10 years until age 15. However, if early forms of precancerous polyps or small growths are found or if you have a family history or genetic risk for colorectal cancer, you may need to be screened more often.  Skin Cancer  Check your skin from head to toe regularly.  Monitor any moles. Be sure to tell your health care provider: ? About any new moles or changes in moles, especially if there is a change in a mole's shape or color. ? If you have a mole that is larger than the size of a pencil eraser.  If any of your family members has a history of skin cancer, especially at a young age, talk with your health care provider about genetic  screening.  Always use sunscreen. Apply sunscreen liberally and repeatedly throughout the day.  Whenever you are outside, protect yourself by wearing long sleeves, pants, a wide-brimmed hat, and sunglasses.  What should I know about osteoporosis? Osteoporosis is a condition in which bone destruction happens more quickly than new bone creation. After menopause, you may be at an increased risk for osteoporosis. To help prevent osteoporosis or the bone fractures that can happen because of osteoporosis, the following is recommended:  If you are 67-13 years old, get at least 1,000 mg of calcium and at least 600 mg of vitamin D per day.  If you are older than age 71 but younger than age 53, get at least 1,200 mg of calcium and at least 600 mg  of vitamin D per day.  If you are older than age 46, get at least 1,200 mg of calcium and at least 800 mg of vitamin D per day.  Smoking and excessive alcohol intake increase the risk of osteoporosis. Eat foods that are rich in calcium and vitamin D, and do weight-bearing exercises several times each week as directed by your health care provider. What should I know about how menopause affects my mental health? Depression may occur at any age, but it is more common as you become older. Common symptoms of depression include:  Low or sad mood.  Changes in sleep patterns.  Changes in appetite or eating patterns.  Feeling an overall lack of motivation or enjoyment of activities that you previously enjoyed.  Frequent crying spells.  Talk with your health care provider if you think that you are experiencing depression. What should I know about immunizations? It is important that you get and maintain your immunizations. These include:  Tetanus, diphtheria, and pertussis (Tdap) booster vaccine.  Influenza every year before the flu season begins.  Pneumonia vaccine.  Shingles vaccine.  Your health care provider may also recommend other  immunizations. This information is not intended to replace advice given to you by your health care provider. Make sure you discuss any questions you have with your health care provider. Document Released: 11/30/2005 Document Revised: 04/27/2016 Document Reviewed: 07/12/2015 Elsevier Interactive Patient Education  2018 [employer].

## 2017-07-06 NOTE — Progress Notes (Signed)
[place] at [facility] [address] [place], [place] [address] [phone]  Date:  07/08/2017   Name:  Marilyn Hess   DOB:  Oct 06, 1946   MRN:  [id number]  PCP:  Darreld Mclean, MD    Chief Complaint: Annual Exam (Knee pain Right)   History of Present Illness:  Marilyn Hess is a 71 y.o. very pleasant female patient who presents with the following:  Here today for a CPE Last seen by myself about one year ago for her CPE   Hyperlipidemia- chronic problem. Attempting to control w/ healthy diet and regular exercise. Overweight- pt's BMI is 26. She is exercising regularly, although not as much as previously. Physical Activity: exercising regularly, enjoys walking I am taking over for her prior PCP, Dr Birdie Riddle,  who has moved to a new location She is fasting this am for labs  Her last pap was about 5 years ago, never had an abnl She gets mammo every 2 years  Her shots are all UTD excpet for her flu shot. She would prefer to wait on this as she is a bit ill today, she can have this done at [facility] for free  She has been mildly ill with a cough for about 2 weeks.  She is using an OTC nyquil at night but is still not sleeping well.   No fevers noted.  States that she often gets this sort of symptoms a couple of times a year and it will resolve without incident. Her only real concern is that she cannot sleep due to cough.  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    She has not been walking as much due to a left knee injury. 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 She has had pain since- the knee swelled some but did not bruise at the time.  She has not really noted popping or clicking, but has medial knee pain still.  She is wearing a neoprene knee brace that she found at home  She is fasting this am for labs  We will do a flu shot for her today We will set up a bone density scan for her today She is taking OTC calcium and vitamin D- we will check her D level for her today.  She is trying to get more daily in her diet  She had a negative pap about one year ago- never had a positive screening. She is not SA at this time   BP Readings from Last 3 Encounters:  07/08/17 (!) 143/80  04/08/17 118/62  07/05/16 117/75   She is using ice, icy hot and ibuprofen, tylenol  Prior to hurting her knee she was getting more exercise, has lost a little weight Only rx is metrogel for rosacea, she does not need a refill today however   Wt Readings from Last 3 Encounters:  07/08/17 137 lb 9.6 oz (  62.4 kg)  04/08/17 145 lb 8 oz (66 kg)  07/05/16 138 lb (62.6 kg)    Patient Active Problem List   Diagnosis Date Noted  . Hyperlipidemia 02/07/2015  . Overweight (BMI 25.0-29.9) 08/05/2014  . Osteopenia 07/10/2012  . Physical exam, annual 07/10/2012  . ROSACEA 12/22/2010  . CARPAL TUNNEL SYNDROME 10/20/2007  . 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 LACTOSE INTOLERANCE 01/16/2007  . BREAST MASS, BENIGN 01/16/2007    Past Medical History:  Diagnosis Date  . Breast lump   . Hyperlipidemia     Past Surgical History:  Procedure Laterality Date  . CARPAL TUNNEL RELEASE  11-2014  . TOOTH EXTRACTION  07-2011    Social History  Substance Use Topics  . Smoking status: Never Smoker  . Smokeless tobacco: Never Used  . Alcohol use No    Family History  Problem Relation Age of Onset  . Adopted: Yes  . Stroke Father   . Alcohol abuse Father   .  Hypertension Father   . Alzheimer's disease Mother   . Hypertension Brother   . Colon cancer Neg Hx     Allergies  Allergen Reactions  . Cinnamon     Headache, breathing problem to long exposure    Medication list has been reviewed and updated.  Current Outpatient Prescriptions on File Prior to Visit  Medication Sig Dispense Refill  . Ascorbic Acid (VITAMIN C PO) Take by mouth. Reported on 10/05/2015    . aspirin EC 81 MG tablet Take 81 mg by mouth daily.    . calcium carbonate (OS-CAL - DOSED IN MG OF ELEMENTAL CALCIUM) 1250 (500 CA) MG tablet Take 1 tablet by mouth. Reported on 10/05/2015    . metroNIDAZOLE (METROGEL) 1 % gel Apply 1 application topically daily. Reported on 10/05/2015 45 g 6  . Vitamin D, Ergocalciferol, (DRISDOL) 50000 UNITS CAPS capsule Take 1 capsule (50,000 Units total) by mouth every 7 (seven) days. 12 capsule 0   No current facility-administered medications on file prior to visit.     Review of Systems:  As per HPI- otherwise negative. She is able to exercise with no CP or SOB She does see dermatology and has regular skin checks Positive for knee pain, no nausea, vomiting or diarrhea   Physical Examination: Vitals:   07/08/17 0822 07/08/17 0828  BP: (!) 157/69 (!) 143/80  Pulse: 62   Temp: 97.7 F (36.5 C)   SpO2: 100%    Vitals:   07/08/17 0822  Weight: 137 lb 9.6 oz (62.4 kg)  Height: 5\' 2"  (1.575 m)   Body mass index is 25.17 kg/m. Ideal Body Weight: Weight in (lb) to have BMI = 25: 136.4  GEN: WDWN, NAD, Non-toxic, A & O x 3, normal weight, looks well HEENT: Atraumatic, Normocephalic. Neck supple. No masses, No LAD.  Bilateral TM wnl, oropharynx normal.  PEERL,EOMI.   Ears and Nose: No external deformity. CV: RRR, No M/G/R. No JVD. No thrill. No extra heart sounds. PULM: CTA B, no wheezes, crackles, rhonchi. No retractions. No resp. distress. No accessory muscle use. ABD: S, NT, ND. No rebound. No HSM. EXTR: No c/c/e NEURO Normal  gait.  PSYCH: Normally interactive. Conversant. Not depressed or anxious appearing.  Calm demeanor.  Right knee: no swelling of effusion.  Normal ROM but she has pain with full flexion, and pain at medial joint line.  Knee does feel stable, some crepitus  Assessment and Plan: Physical exam  Screening for diabetes mellitus - Plan: Comprehensive metabolic  panel, Hemoglobin A1c, Comprehensive metabolic panel, Hemoglobin A1c  Vitamin D deficiency - Plan: Vitamin D (25 hydroxy), Vitamin D (25 hydroxy)  Screening for deficiency anemia - Plan: CBC, CBC  Dyslipidemia - Plan: Lipid panel, Lipid panel  Encounter for hepatitis C screening test for low risk patient - Plan: Hepatitis C antibody, Hepatitis C antibody  Acute pain of right knee - Plan: Ambulatory referral to Orthopedic Surgery, CANCELED: AMB referral to orthopedics  Estrogen deficiency - Plan: DG Bone Density  CPE Labs pending as above Referral to ortho for her knee pain.  Defer x-rays today as will not be covered at a CPE visit- discussed knee care for the meantime  Will plan further follow- up pending labs. Dexa ordered for her today   Signed Lamar Blinks, MD

## 2017-07-08 ENCOUNTER — Ambulatory Visit (INDEPENDENT_AMBULATORY_CARE_PROVIDER_SITE_OTHER): Payer: Medicare Other | Admitting: Family Medicine

## 2017-07-08 VITALS — BP 120/79 | HR 62 | Temp 97.7°F | Ht 62.0 in | Wt 137.6 lb

## 2017-07-08 DIAGNOSIS — Z23 Encounter for immunization: Secondary | ICD-10-CM | POA: Diagnosis not present

## 2017-07-08 DIAGNOSIS — Z0001 Encounter for general adult medical examination with abnormal findings: Secondary | ICD-10-CM

## 2017-07-08 DIAGNOSIS — E2839 Other primary ovarian failure: Secondary | ICD-10-CM

## 2017-07-08 DIAGNOSIS — E559 Vitamin D deficiency, unspecified: Secondary | ICD-10-CM

## 2017-07-08 DIAGNOSIS — Z1159 Encounter for screening for other viral diseases: Secondary | ICD-10-CM

## 2017-07-08 DIAGNOSIS — Z0000 Encounter for general adult medical examination without abnormal findings: Secondary | ICD-10-CM

## 2017-07-08 DIAGNOSIS — Z130 Encounter for screening for diseases of the blood and blood-forming organs and certain disorders involving the immune mechanism: Secondary | ICD-10-CM | POA: Diagnosis not present

## 2017-07-08 DIAGNOSIS — E785 Hyperlipidemia, unspecified: Secondary | ICD-10-CM | POA: Diagnosis not present

## 2017-07-08 DIAGNOSIS — Z131 Encounter for screening for diabetes mellitus: Secondary | ICD-10-CM

## 2017-07-08 DIAGNOSIS — M25561 Pain in right knee: Secondary | ICD-10-CM

## 2017-07-08 LAB — COMPREHENSIVE METABOLIC PANEL
ALBUMIN: 4.2 g/dL (ref 3.5–5.2)
ALT: 15 U/L (ref 0–35)
AST: 22 U/L (ref 0–37)
Alkaline Phosphatase: 54 U/L (ref 39–117)
BUN: 16 mg/dL (ref 6–23)
CHLORIDE: 102 meq/L (ref 96–112)
CO2: 28 meq/L (ref 19–32)
CREATININE: 0.86 mg/dL (ref 0.40–1.20)
Calcium: 9.7 mg/dL (ref 8.4–10.5)
GFR: 69.54 mL/min (ref >60.00)
GLUCOSE: 104 mg/dL — AB (ref 70–99)
POTASSIUM: 4.2 meq/L (ref 3.5–5.1)
SODIUM: 137 meq/L (ref 135–145)
Total Bilirubin: 0.6 mg/dL (ref 0.2–1.2)
Total Protein: 6.8 g/dL (ref 6.0–8.3)

## 2017-07-08 LAB — LIPID PANEL
CHOL/HDL RATIO: 2
CHOLESTEROL: 209 mg/dL — AB (ref 0–200)
HDL: 94.3 mg/dL (ref >39.00)
LDL CALC: 103 mg/dL — AB (ref 0–99)
NONHDL: 114.44
Triglycerides: 56 mg/dL (ref 0.0–149.0)
VLDL: 11.2 mg/dL (ref 0.0–40.0)

## 2017-07-08 LAB — CBC
HCT: 41.1 % (ref 36.0–46.0)
Hemoglobin: 13.5 g/dL (ref 12.0–15.0)
MCHC: 33 g/dL (ref 30.0–36.0)
MCV: 93.6 fl (ref 78.0–100.0)
Platelets: 256 10*3/uL (ref 150.0–400.0)
RBC: 4.39 Mil/uL (ref 3.87–5.11)
RDW: 11.9 % (ref 11.5–15.5)
WBC: 5.4 10*3/uL (ref 4.0–10.5)

## 2017-07-08 LAB — HEPATITIS C ANTIBODY
HEP C AB: NONREACTIVE
SIGNAL TO CUT-OFF: 0.01 (ref <1.00)

## 2017-07-08 LAB — VITAMIN D 25 HYDROXY (VIT D DEFICIENCY, FRACTURES): VITD: 26.79 ng/mL — ABNORMAL LOW (ref 30.00–100.00)

## 2017-07-08 LAB — HEMOGLOBIN A1C: Hgb A1c MFr Bld: 5.7 % (ref 4.6–6.5)

## 2017-07-08 NOTE — Patient Instructions (Addendum)
It was good to see you today!  I will be in touch with your labs asap  We will also get you set up for a bone density scan We are referring you to [facility] orthopedics to see a doctor there about your knee. In the meantime, a pull on knee sleeve may be helpful, and please continue to use ice, ibuprofen and tylenol as needed  If your knee is getting worse prior to your orthopedics appt please let me know Your blood pressure is fine today

## 2017-07-09 ENCOUNTER — Encounter: Payer: Self-pay | Admitting: Family Medicine

## 2017-07-11 ENCOUNTER — Telehealth: Payer: Self-pay | Admitting: Family Medicine

## 2017-07-11 ENCOUNTER — Ambulatory Visit (HOSPITAL_BASED_OUTPATIENT_CLINIC_OR_DEPARTMENT_OTHER)
Admission: RE | Admit: 2017-07-11 | Discharge: 2017-07-11 | Disposition: A | Discharge status: Home / Self Care | Payer: Medicare Other | Source: Ambulatory Visit | Attending: Family Medicine | Admitting: Family Medicine

## 2017-07-11 DIAGNOSIS — E2839 Other primary ovarian failure: Secondary | ICD-10-CM | POA: Insufficient documentation

## 2017-07-11 DIAGNOSIS — M8588 Other specified disorders of bone density and structure, other site: Secondary | ICD-10-CM | POA: Insufficient documentation

## 2017-07-11 DIAGNOSIS — M85859 Other specified disorders of bone density and structure, unspecified thigh: Secondary | ICD-10-CM

## 2017-07-11 MED ORDER — ALENDRONATE SODIUM 70 MG PO TABS: 70.0000 mg | ORAL_TABLET | ORAL | 11 refills | Status: DC

## 2017-07-11 NOTE — Telephone Encounter (Signed)
Called pt and discussed her Dexa - she does have osteopenia, and her risk of hip fracture is over 3%. She is willing to start on a bone building medication.  Will start her on fosamax, plan to repeat her dexa in 1-2 years. She does not have any difficulty with swallowing  Dg Bone Density  Result Date: 07/11/2017 EXAM: DUAL X-RAY ABSORPTIOMETRY (DXA) FOR BONE MINERAL DENSITY IMPRESSION: Referring Physician:  Darreld Mclean PATIENT: Name: Marilyn Hess, Marilyn Hess Patient ID: [id number] Birth Date: January 01, 1946 Height: 62.0 in. Sex: Female Measured: 07/11/2017 Weight: 136.2 lbs. Indications: Advanced Age, Caucasian, Estrogen Deficiency, Post Menopausal Fractures: Foot Treatments: Calcium, Vitamin D ASSESSMENT: The BMD measured at Femur Neck Right is 0.710 g/cm2 with a T-score of -2.4. This patient is considered osteopenic according to [facility]) criteria. Site Region Measured Date Measured Age WHO YA BMD Classification T-score AP Spine L1-L4 07[age]11/2017 68.9 Osteopenia -2.1 0.929 g/cm2 DualFemur Neck Right 9/11/2017 68.9 years Osteopenia -2.4 0.710 g/cm2 World Health Organization [facility]) criteria for post-menopausal, Caucasian Women: Normal       T-score at or above -1 SD Osteopenia   T-score between -1 and -2.5 SD Osteoporosis T-score at or below -2.5 SD RECOMMENDATION: [place] recommends that FDA-approved medical therapies be considered in postmenopausal women and men age 75 or older with a: 1. Hip or vertebral (clinical or morphometric) fracture. 2. T-score of < -2.5 at the spine or hip. 3. Ten-year fracture probability by FRAX of 3% or greater for hip fracture or 20% or greater for major osteoporotic fracture. All treatment decisions require clinical judgment and consideration of individual patient factors, including patient preferences, co-morbidities, previous drug use, risk factors not captured in the FRAX model (e.g. falls, vitamin D deficiency, increased bone turnover,  interval significant decline in bone density) and possible under - or over-estimation of fracture risk by FRAX. All patients should ensure an adequate intake of dietary calcium (1200 mg/d) and vitamin D (800 IU daily) unless contraindicated. FOLLOW-UP: People with diagnosed cases of osteoporosis or at high risk for fracture should have regular bone mineral density tests. For patients eligible for Medicare, routine testing is allowed once every 2 years. The testing frequency can be increased to one year for patients who have rapidly progressing disease, those who are receiving or discontinuing medical therapy to restore bone mass, or have additional risk factors. I have reviewed this report and agree with the above findings.  Radiology Patient: Scherrie Bateman   Referring Physician: Darreld Mclean Birth Date: 1946-08-26 Age:       71 years Patient ID: [id number] Height: 62.0 in. Weight: 136.2 lbs. Measured: 07/11/2017 9:11:34 AM (16 SP 2) Sex: Female Ethnicity: White Analyzed: 07/11/2017 9:12:17 AM (16 SP 2) FRAX* 10-year Probability of Fracture Based on femoral neck BMD: DualFemur (Right) Major Osteoporotic Fracture: 13.9% Hip Fracture:                3.2% Population:                  [place] (Caucasian) Risk Factors:                None *FRAX is a Materials engineer of the [employer] for Metabolic Bone Disease, a World Pharmacologist (WHO) [facility]. ASSESSMENT: The probability of a major osteoporotic fracture is 13.9% within the next ten years. The probability of a hip fracture is 3.2% within the next ten years. Electronically Signed   By: Rolm Baptise M.D.  On: 07/11/2017 09:36

## 2018-01-07 ENCOUNTER — Telehealth: Payer: Self-pay | Admitting: Family Medicine

## 2018-01-07 MED ORDER — METRONIDAZOLE 1 % EX GEL: 1.0000 "application " | Freq: Every day | CUTANEOUS | 6 refills | Status: DC

## 2018-01-07 NOTE — Telephone Encounter (Signed)
Please give her a call.  I refilled her metrogel. As long as she does not have any personal history of abnormal mammogram or family history of breast cancer, I agree that she can have a mammogram either every 1 or every 2 years.  This is up to her discretion

## 2018-01-07 NOTE — Telephone Encounter (Signed)
Copied from [place]. Topic: Quick Communication - Rx Refill/Question >> Jan 07, 2018 10:31 AM Margot Ables wrote: Medication: metroNIDAZOLE (METROGEL) 1 % gel - pt is out of this gel - she didn't realize the RX was expired - she uses for Rosacea Has the patient contacted their pharmacy? Yes.  Expired RX Preferred Pharmacy (with phone number or street name): [place], [place]. [phone] (Phone) [phone] (Fax)  Pt asking if she needs to have MM yearly or every 2 years. She received a letter from [place] at [facility] for a yearly appt. Please advise.

## 2018-01-08 NOTE — Telephone Encounter (Signed)
Pt informed that metrogel has been sent to pharmacy and that per Dr. Lorelei Pont she can have her mammogram either yearly or every two years if no personal history of abnormal mammogram or fam hx of breast cancer.   Pt states that she received a letter from the [facility] stating that it is time for her yearly exam. She been told that her left breast is dense. Informed pt that [facility] to [facility] an appointment to have her yearly exam. Pt verbalized understanding.

## 2018-01-14 ENCOUNTER — Other Ambulatory Visit: Payer: Self-pay | Admitting: Family Medicine

## 2018-01-14 DIAGNOSIS — Z1231 Encounter for screening mammogram for malignant neoplasm of breast: Secondary | ICD-10-CM

## 2018-02-03 ENCOUNTER — Ambulatory Visit
Admission: RE | Admit: 2018-02-03 | Discharge: 2018-02-03 | Disposition: A | Discharge status: Home / Self Care | Payer: Medicare Other | Source: Ambulatory Visit | Attending: Family Medicine | Admitting: Family Medicine

## 2018-02-03 DIAGNOSIS — Z1231 Encounter for screening mammogram for malignant neoplasm of breast: Secondary | ICD-10-CM

## 2018-04-07 NOTE — Progress Notes (Signed)
Subjective:   Marilyn Hess is a 72 y.o. female who presents for Medicare Annual (Subsequent) preventive examination. Pt very energetic and pleasant.  Review of Systems: No ROS.  Medicare Wellness Visit. Additional risk factors are reflected in the social history. Cardiac Risk Factors include: advanced age (>90men, >10 women);dyslipidemia Sleep patterns:  Sleeps 4-5 hrs. States this is normal for her. Naps daily.  Home Safety/Smoke Alarms: Feels safe in home. Smoke alarms in place.  Living environment; residence and Firearm Safety: Lives alone in 1 story home.   Female:   Pap- 07/05/16-normal      Mammo- utd      Dexa scan- utd       CCS- Due 10/05/20     Objective:     Vitals: BP 112/60 (BP Location: Left Arm, Patient Position: Sitting, Cuff Size: Normal)   Pulse 72   Ht 5\' 2"  (1.575 m)   Wt 143 lb (64.9 kg)   SpO2 98%   BMI 26.16 kg/m   Body mass index is 26.16 kg/m.  Advanced Directives 04/11/2018 04/08/2017 09/21/2015  Does Patient Have a Medical Advance Directive? No No No  Would patient like information on creating a medical advance directive? Yes (MAU/Ambulatory/Procedural Areas - Information given) Yes (MAU/Ambulatory/Procedural Areas - Information given) No - patient declined information    Tobacco Social History   Tobacco Use  Smoking Status Never Smoker  Smokeless Tobacco Never Used     Counseling given: Not Answered   Clinical Intake: Pain : No/denies pain   Past Medical History:  Diagnosis Date  . Breast lump   . Hyperlipidemia    Past Surgical History:  Procedure Laterality Date  . CARPAL TUNNEL RELEASE  11-2014  . TOOTH EXTRACTION  07-2011   Family History  Adopted: Yes  Problem Relation Age of Onset  . Stroke Father   . Alcohol abuse Father   . Hypertension Father   . Alzheimer's disease Mother   . Hypertension Brother   . Colon cancer Neg Hx    Social History   Socioeconomic History  . Marital status: Divorced    Spouse  name: Not on file  . Number of children: Not on file  . Years of education: Not on file  . Highest education level: Not on file  Occupational History  . Not on file  Social Needs  . Financial resource strain: Not on file  . Food insecurity:    Worry: Not on file    Inability: Not on file  . Transportation needs:    Medical: Not on file    Non-medical: Not on file  Tobacco Use  . Smoking status: Never Smoker  . Smokeless tobacco: Never Used  Substance and Sexual Activity  . Alcohol use: No    Alcohol/week: 0.0 oz  . Drug use: No  . Sexual activity: Never  Lifestyle  . Physical activity:    Days per week: Not on file    Minutes per session: Not on file  . Stress: Not on file  Relationships  . Social connections:    Talks on phone: Not on file    Gets together: Not on file    Attends religious service: Not on file    Active member of club or organization: Not on file    Attends meetings of clubs or organizations: Not on file    Relationship status: Not on file  Other Topics Concern  . Not on file  Social History Narrative  . Not on  file    Outpatient Encounter Medications as of 04/11/2018  Medication Sig  . alendronate (FOSAMAX) 70 MG tablet Take 1 tablet (70 mg total) by mouth every 7 (seven) days. Take with a full glass of water on an empty stomach.  . calcium carbonate (OS-CAL - DOSED IN MG OF ELEMENTAL CALCIUM) 1250 (500 CA) MG tablet Take 1 tablet by mouth. Reported on 10/05/2015  . calcium-vitamin D (OSCAL WITH D) 500-200 MG-UNIT tablet Take 1 tablet by mouth.  . metroNIDAZOLE (METROGEL) 1 % gel Apply 1 application topically daily. Reported on 10/05/2015  . Ascorbic Acid (VITAMIN C PO) Take by mouth. Reported on 10/05/2015  . aspirin EC 81 MG tablet Take 81 mg by mouth daily.   No facility-administered encounter medications on file as of 04/11/2018.     Activities of Daily Living In your present state of health, do you have any difficulty performing the  following activities: 04/11/2018  Hearing? N  Vision? N  Difficulty concentrating or making decisions? N  Walking or climbing stairs? N  Dressing or bathing? N  Doing errands, shopping? N  Preparing Food and eating ? N  Using the Toilet? N  In the past six months, have you accidently leaked urine? Y  Comment wears panty liner  Do you have problems with loss of bowel control? N  Managing your Medications? N  Managing your Finances? N  Housekeeping or managing your Housekeeping? N  Some recent data might be hidden    Patient Care Team: Copland, Gay Filler, MD as PCP - General (Family Medicine) [place], Jennefer Bravo, MD as Referring Physician (Dermatology)    Assessment:   This is a routine wellness examination for Marilyn Hess. Physical assessment deferred to PCP.  Exercise Activities and Dietary recommendations Current Exercise Habits: The patient does not participate in regular exercise at present, Exercise limited by: None identified Diet (meal preparation, eat out, water intake, caffeinated beverages, dairy products, fruits and vegetables): well balanced   Goals    . DIET - INCREASE WATER INTAKE       Fall Risk Fall Risk  04/11/2018 04/08/2017 08/08/2015 08/05/2014  Falls in the past year? No No No No    Depression Screen PHQ 2/9 Scores 04/11/2018 04/08/2017 08/08/2015 08/05/2014  PHQ - 2 Score 0 0 0 0     Cognitive Function Ad8 score reviewed for issues:  Issues making decisions::no  Repeats questions, stories (family complaining):no  Trouble using ordinary gadgets (microwave, computer, phone):no  Forgets the month or year: no  Mismanaging finances: no  Remembering appts:n0  Daily problems with thinking and/or memory:no Ad8 score is=0        Immunization History  Administered Date(s) Administered  . Influenza, High Dose Seasonal PF 07/08/2017  . Influenza,inj,Quad PF,6+ Mos 08/05/2013, 08/08/2015  . Pneumococcal Conjugate-13 08/05/2014  .  Pneumococcal Polysaccharide-23 08/08/2015  . Tdap 06/07/2015  . Zoster 02/21/2016   Screening Tests Health Maintenance  Topic Date Due  . INFLUENZA VACCINE  05/22/2018  . MAMMOGRAM  02/04/2019  . COLONOSCOPY  10/04/2020  . TETANUS/TDAP  06/06/2025  . DEXA SCAN  Completed  . Hepatitis C Screening  Completed  . PNA vac Low Risk Adult  Completed      Plan:    Please schedule your next medicare wellness visit with me in 1 yr.  Continue to eat heart healthy diet (full of fruits, vegetables, whole grains, lean protein, water--limit salt, fat, and sugar intake) and increase physical activity as tolerated.  Continue doing brain stimulating  activities (puzzles, reading, adult coloring books, staying active) to keep memory sharp.   Bring a copy of your living will and/or healthcare power of attorney to your next office visit.    I have personally reviewed and noted the following in the patient's chart:   . Medical and social history . Use of alcohol, tobacco or illicit drugs  . Current medications and supplements . Functional ability and status . Nutritional status . Physical activity . Advanced directives . List of other physicians . Hospitalizations, surgeries, and ER visits in previous 12 months . Vitals . Screenings to include cognitive, depression, and falls . Referrals and appointments  In addition, I have reviewed and discussed with patient certain preventive protocols, quality metrics, and best practice recommendations. A written personalized care plan for preventive services as well as general preventive health recommendations were provided to patient.     Shela Nevin, [place]  04/11/2018

## 2018-04-09 ENCOUNTER — Ambulatory Visit: Payer: Medicare Other | Admitting: *Deleted

## 2018-04-11 ENCOUNTER — Ambulatory Visit (INDEPENDENT_AMBULATORY_CARE_PROVIDER_SITE_OTHER): Payer: Medicare Other | Admitting: *Deleted

## 2018-04-11 ENCOUNTER — Encounter: Payer: Self-pay | Admitting: *Deleted

## 2018-04-11 VITALS — BP 112/60 | HR 72 | Ht 62.0 in | Wt 143.0 lb

## 2018-04-11 DIAGNOSIS — Z0000 Encounter for general adult medical examination without abnormal findings: Secondary | ICD-10-CM

## 2018-04-11 NOTE — Progress Notes (Signed)
Reviewed  Yvonne R Lowne Chase, DO  

## 2018-04-11 NOTE — Patient Instructions (Signed)
Please schedule your next medicare wellness visit with me in 1 yr.  Continue to eat heart healthy diet (full of fruits, vegetables, whole grains, lean protein, water--limit salt, fat, and sugar intake) and increase physical activity as tolerated.  Continue doing brain stimulating activities (puzzles, reading, adult coloring books, staying active) to keep memory sharp.   Bring a copy of your living will and/or healthcare power of attorney to your next office visit.   Marilyn Hess , Thank you for taking time to come for your Medicare Wellness Visit. I appreciate your ongoing commitment to your health goals. Please review the following plan we discussed and let me know if I can assist you in the future.   These are the goals we discussed: Goals    . DIET - INCREASE WATER INTAKE       This is a list of the screening recommended for you and due dates:  Health Maintenance  Topic Date Due  . Flu Shot  05/22/2018  . Mammogram  02/04/2019  . Colon Cancer Screening  10/04/2020  . Tetanus Vaccine  06/06/2025  . DEXA scan (bone density measurement)  Completed  .  Hepatitis C: One time screening is recommended by Center for Disease Control  (CDC) for  adults born from [birth year] through 1963.   Completed  . Pneumonia vaccines  Completed    Health Maintenance for Postmenopausal Women Menopause is a normal process in which your reproductive ability comes to an end. This process happens gradually over a span of months to years, usually between the ages of 64 and 34. Menopause is complete when you have missed 12 consecutive menstrual periods. It is important to talk with your health care provider about some of the most common conditions that affect postmenopausal women, such as heart disease, cancer, and bone loss (osteoporosis). Adopting a healthy lifestyle and getting preventive care can help to promote your health and wellness. Those actions can also lower your chances of developing some of these common  conditions. What should I know about menopause? During menopause, you may experience a number of symptoms, such as:  Moderate-to-severe hot flashes.  Night sweats.  Decrease in sex drive.  Mood swings.  Headaches.  Tiredness.  Irritability.  Memory problems.  Insomnia.  Choosing to treat or not to treat menopausal changes is an individual decision that you make with your health care provider. What should I know about hormone replacement therapy and supplements? Hormone therapy products are effective for treating symptoms that are associated with menopause, such as hot flashes and night sweats. Hormone replacement carries certain risks, especially as you become older. If you are thinking about using estrogen or estrogen with progestin treatments, discuss the benefits and risks with your health care provider. What should I know about heart disease and stroke? Heart disease, heart attack, and stroke become more likely as you age. This may be due, in part, to the hormonal changes that your body experiences during menopause. These can affect how your body processes dietary fats, triglycerides, and cholesterol. Heart attack and stroke are both medical emergencies. There are many things that you can do to help prevent heart disease and stroke:  Have your blood pressure checked at least every 1-2 years. High blood pressure causes heart disease and increases the risk of stroke.  If you are 36-70 years old, ask your health care provider if you should take aspirin to prevent a heart attack or a stroke.  Do not use any tobacco products, including cigarettes,  chewing tobacco, or electronic cigarettes. If you need help quitting, ask your health care provider.  It is important to eat a healthy diet and maintain a healthy weight. ? Be sure to include plenty of vegetables, fruits, low-fat dairy products, and lean protein. ? Avoid eating foods that are high in solid fats, added sugars, or salt  (sodium).  Get regular exercise. This is one of the most important things that you can do for your health. ? Try to exercise for at least 150 minutes each week. The type of exercise that you do should increase your heart rate and make you sweat. This is known as moderate-intensity exercise. ? Try to do strengthening exercises at least twice each week. Do these in addition to the moderate-intensity exercise.  Know your numbers.Ask your health care provider to check your cholesterol and your blood glucose. Continue to have your blood tested as directed by your health care provider.  What should I know about cancer screening? There are several types of cancer. Take the following steps to reduce your risk and to catch any cancer development as early as possible. Breast Cancer  Practice breast self-awareness. ? This means understanding how your breasts normally appear and feel. ? It also means doing regular breast self-exams. Let your health care provider know about any changes, no matter how small.  If you are 34 or older, have a clinician do a breast exam (clinical breast exam or CBE) every year. Depending on your age, family history, and medical history, it may be recommended that you also have a yearly breast X-ray (mammogram).  If you have a family history of breast cancer, talk with your health care provider about genetic screening.  If you are at high risk for breast cancer, talk with your health care provider about having an MRI and a mammogram every year.  Breast cancer (BRCA) gene test is recommended for women who have family members with BRCA-related cancers. Results of the assessment will determine the need for genetic counseling and BRCA1 and for BRCA2 testing. BRCA-related cancers include these types: ? Breast. This occurs in males or females. ? Ovarian. ? Tubal. This may also be called fallopian tube cancer. ? Cancer of the abdominal or pelvic lining (peritoneal  cancer). ? Prostate. ? Pancreatic.  Cervical, Uterine, and Ovarian Cancer Your health care provider may recommend that you be screened regularly for cancer of the pelvic organs. These include your ovaries, uterus, and vagina. This screening involves a pelvic exam, which includes checking for microscopic changes to the surface of your cervix (Pap test).  For women ages 21-65, health care providers may recommend a pelvic exam and a Pap test every three years. For women ages 29-65, they may recommend the Pap test and pelvic exam, combined with testing for human papilloma virus (HPV), every five years. Some types of HPV increase your risk of cervical cancer. Testing for HPV may also be done on women of any age who have unclear Pap test results.  Other health care providers may not recommend any screening for nonpregnant women who are considered low risk for pelvic cancer and have no symptoms. Ask your health care provider if a screening pelvic exam is right for you.  If you have had past treatment for cervical cancer or a condition that could lead to cancer, you need Pap tests and screening for cancer for at least 20 years after your treatment. If Pap tests have been discontinued for you, your risk factors (such as having  a new sexual partner) need to be reassessed to determine if you should start having screenings again. Some women have medical problems that increase the chance of getting cervical cancer. In these cases, your health care provider may recommend that you have screening and Pap tests more often.  If you have a family history of uterine cancer or ovarian cancer, talk with your health care provider about genetic screening.  If you have vaginal bleeding after reaching menopause, tell your health care provider.  There are currently no reliable tests available to screen for ovarian cancer.  Lung Cancer Lung cancer screening is recommended for adults 37-17 years old who are at high risk for  lung cancer because of a history of smoking. A yearly low-dose CT scan of the lungs is recommended if you:  Currently smoke.  Have a history of at least 30 pack-years of smoking and you currently smoke or have quit within the past 15 years. A pack-year is smoking an average of one pack of cigarettes per day for one year.  Yearly screening should:  Continue until it has been 15 years since you quit.  Stop if you develop a health problem that would prevent you from having lung cancer treatment.  Colorectal Cancer  This type of cancer can be detected and can often be prevented.  Routine colorectal cancer screening usually begins at age 48 and continues through age 35.  If you have risk factors for colon cancer, your health care provider may recommend that you be screened at an earlier age.  If you have a family history of colorectal cancer, talk with your health care provider about genetic screening.  Your health care provider may also recommend using home test kits to check for hidden blood in your stool.  A small camera at the end of a tube can be used to examine your colon directly (sigmoidoscopy or colonoscopy). This is done to check for the earliest forms of colorectal cancer.  Direct examination of the colon should be repeated every 5-10 years until age 62. However, if early forms of precancerous polyps or small growths are found or if you have a family history or genetic risk for colorectal cancer, you may need to be screened more often.  Skin Cancer  Check your skin from head to toe regularly.  Monitor any moles. Be sure to tell your health care provider: ? About any new moles or changes in moles, especially if there is a change in a mole's shape or color. ? If you have a mole that is larger than the size of a pencil eraser.  If any of your family members has a history of skin cancer, especially at a young age, talk with your health care provider about genetic  screening.  Always use sunscreen. Apply sunscreen liberally and repeatedly throughout the day.  Whenever you are outside, protect yourself by wearing long sleeves, pants, a wide-brimmed hat, and sunglasses.  What should I know about osteoporosis? Osteoporosis is a condition in which bone destruction happens more quickly than new bone creation. After menopause, you may be at an increased risk for osteoporosis. To help prevent osteoporosis or the bone fractures that can happen because of osteoporosis, the following is recommended:  If you are 90-62 years old, get at least 1,000 mg of calcium and at least 600 mg of vitamin D per day.  If you are older than age 39 but younger than age 20, get at least 1,200 mg of calcium and at  least 600 mg of vitamin D per day.  If you are older than age 46, get at least 1,200 mg of calcium and at least 800 mg of vitamin D per day.  Smoking and excessive alcohol intake increase the risk of osteoporosis. Eat foods that are rich in calcium and vitamin D, and do weight-bearing exercises several times each week as directed by your health care provider. What should I know about how menopause affects my mental health? Depression may occur at any age, but it is more common as you become older. Common symptoms of depression include:  Low or sad mood.  Changes in sleep patterns.  Changes in appetite or eating patterns.  Feeling an overall lack of motivation or enjoyment of activities that you previously enjoyed.  Frequent crying spells.  Talk with your health care provider if you think that you are experiencing depression. What should I know about immunizations? It is important that you get and maintain your immunizations. These include:  Tetanus, diphtheria, and pertussis (Tdap) booster vaccine.  Influenza every year before the flu season begins.  Pneumonia vaccine.  Shingles vaccine.  Your health care provider may also recommend other  immunizations. This information is not intended to replace advice given to you by your health care provider. Make sure you discuss any questions you have with your health care provider. Document Released: 11/30/2005 Document Revised: 04/27/2016 Document Reviewed: 07/12/2015 Elsevier Interactive Patient Education  2018 [employer].

## 2018-07-17 NOTE — Progress Notes (Addendum)
[facility] [address] [place], [place] [address] [phone]  Date:  07/21/2018   Name:  Marilyn Hess   DOB:  Nov 24, 1945   MRN:  [id number]  PCP:  Darreld Mclean, MD    Chief Complaint: Annual Exam   History of Present Illness:  Marilyn Hess is a 72 y.o. very pleasant female patient who presents with the following:  Here today for a CPE History of hyperlipidemia, overweight, osteopenia and rosacea Last seen here by myself about one year ago,but she did come in for her MWE over the summer She is feeling well, no major changes.   Her knee continues to be sometimes troublesome, she sometimes has to curtail her walking routine.  She did see ortho about her knee- she got a cortisone shot, and they mentioned doing a scope. However she is not yet ready to think about surgery.   Mammo: 4/19 Dexa:  9/18 Colon: 12/16 Labs: a year ago, she is fasting today Immun:  Flu only - done today   She is adopted Taking fosamax,calcium and vit D, asa 81, metrogel She is tolerating fosamax ok, no issues here   A few weeks about she happened to check her pulse on a friend's phone and it was 50 This surprised her When exercising she notes that her tolerance has gotten worse, however she thinks this is due to not exercising as much No CP She notes that she has palpitations commonly, this is not new to her. No change here  She mentions a history of a heart valve issue, but this never really bothered her. She did take abx prior to dental procedures in the past but this was stopped more recently as not longer thought necessary   Pulse Readings from Last 3 Encounters:  07/21/18 (!) 41  04/11/18 72  07/08/17 62    Patient Active Problem List   Diagnosis Date Noted  . Hyperlipidemia 02/07/2015  . Overweight (BMI 25.0-29.9) 08/05/2014  . Osteopenia 07/10/2012  . Physical exam, annual 07/10/2012  . ROSACEA 12/22/2010   . CARPAL TUNNEL SYNDROME 10/20/2007  . 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 LACTOSE INTOLERANCE 01/16/2007  . BREAST MASS, BENIGN 01/16/2007    Past Medical History:  Diagnosis Date  . Breast lump   . Hyperlipidemia     Past Surgical History:  Procedure Laterality Date  . CARPAL TUNNEL RELEASE  11-2014  . TOOTH EXTRACTION  07-2011    Social History   Tobacco Use  . Smoking status: Never Smoker  . Smokeless tobacco: Never Used  Substance Use Topics  . Alcohol use: No    Alcohol/week: 0.0 standard drinks  . Drug use: No    Family History  Adopted: Yes  Problem Relation Age of Onset  . Stroke Father   . Alcohol abuse Father   . Hypertension Father   . Alzheimer's disease Mother   . Hypertension Brother   . Colon cancer Neg Hx     Allergies  Allergen Reactions  . Cinnamon     Headache, breathing problem to long exposure    Medication list has been reviewed and updated.  Current Outpatient Medications on File Prior to Visit  Medication Sig Dispense Refill  . alendronate (FOSAMAX) 70 MG tablet Take 1 tablet (70 mg total) by mouth every 7 (seven) days. Take with a full glass of water on an empty stomach. 4 tablet 11  . Ascorbic Acid (VITAMIN C PO)  Take by mouth. Reported on 10/05/2015    . aspirin EC 81 MG tablet Take 81 mg by mouth daily.    . calcium carbonate (OS-CAL - DOSED IN MG OF ELEMENTAL CALCIUM) 1250 (500 CA) MG tablet Take 1 tablet by mouth. Reported on 10/05/2015    . calcium-vitamin D (OSCAL WITH D) 500-200 MG-UNIT tablet Take 1 tablet by mouth.    . metroNIDAZOLE (METROGEL) 1 % gel Apply 1 application topically daily. Reported on 10/05/2015 45 g 6   No current facility-administered medications on file prior to visit.     Review of Systems:  As per HPI- otherwise negative. No PMB No breast changes or concerns    Physical Examination: Vitals:   07/21/18 0834  BP: 136/85  Pulse: (!) 41  Resp: 16  Temp: 97.7 F (36.5 C)  SpO2: 100%   Vitals:    07/21/18 0834  Weight: 142 lb 6.4 oz (64.6 kg)  Height: 5\' 2"  (1.575 m)   Body mass index is 26.05 kg/m. Ideal Body Weight: Weight in (lb) to have BMI = 25: 136.4  GEN: WDWN, NAD, Non-toxic, A & O x 3, looks well, normal weight HEENT: Atraumatic, Normocephalic. Neck supple. No masses, No LAD. Bilateral TM wnl, oropharynx normal.  PEERL,EOMI.   Ears and Nose: No external deformity. CV: seems to be in bigeminy but rate ok at asculatation, No M/G/R. No JVD. No thrill. No extra heart sounds. PULM: CTA B, no wheezes, crackles, rhonchi. No retractions. No resp. distress. No accessory muscle use. ABD: S, NT, ND EXTR: No c/c/e NEURO Normal gait.  PSYCH: Normally interactive. Conversant. Not depressed or anxious appearing.  Calm demeanor.   EKG: bigeminy, rate of 71 I cannot see old EKG for comparison  Assessment and Plan: Physical exam  Dyslipidemia - Plan: Lipid panel  Screening for diabetes mellitus - Plan: CBC, Comprehensive metabolic panel, Hemoglobin A1c  Rosacea  Osteopenia, unspecified location  Needs flu shot - Plan: Flu vaccine HIGH DOSE PF (Fluzone High dose)  Bradycardia - Plan: EKG 12-Lead, Ambulatory referral to Cardiology, TSH  CPE today Noted to be bradycardic and then in bigeminy on EKG Pt relates history of a valve issue and of feeling more tired, having palpitations but NO CP Referral to cardiology for eval Continue fosamax  Flu shot given Will plan further follow- up pending labs.   Signed Lamar Blinks, MD  Received her labs  Results for orders placed or performed in visit on 07/21/18  CBC  Result Value Ref Range   WBC 4.8 4.0 - 10.5 K/uL   RBC 3.96 3.87 - 5.11 Mil/uL   Platelets 207.0 150.0 - 400.0 K/uL   Hemoglobin 12.3 12.0 - 15.0 g/dL   HCT 36.6 36.0 - 46.0 %   MCV 92.4 78.0 - 100.0 fl   MCHC 33.6 30.0 - 36.0 g/dL   RDW 12.4 11.5 - 15.5 %  Comprehensive metabolic panel  Result Value Ref Range   Sodium 141 135 - 145 mEq/L   Potassium 3.7  3.5 - 5.1 mEq/L   Chloride 106 96 - 112 mEq/L   CO2 30 19 - 32 mEq/L   Glucose, Bld 93 70 - 99 mg/dL   BUN 22 6 - 23 mg/dL   Creatinine, Ser 0.82 0.40 - 1.20 mg/dL   Total Bilirubin 0.6 0.2 - 1.2 mg/dL   Alkaline Phosphatase 38 (L) 39 - 117 U/L   AST 20 0 - 37 U/L   ALT 13 0 - 35 U/L  Total Protein 5.8 (L) 6.0 - 8.3 g/dL   Albumin 3.9 3.5 - 5.2 g/dL   Calcium 8.6 8.4 - 10.5 mg/dL   GFR 73.25 >60.00 mL/min  Hemoglobin A1c  Result Value Ref Range   Hgb A1c MFr Bld 5.8 4.6 - 6.5 %  Lipid panel  Result Value Ref Range   Cholesterol 195 0 - 200 mg/dL   Triglycerides 50.0 0.0 - 149.0 mg/dL   HDL 77.00 >39.00 mg/dL   VLDL 10.0 0.0 - 40.0 mg/dL   LDL Cholesterol 108 (H) 0 - 99 mg/dL   Total CHOL/HDL Ratio 3    NonHDL 118.19   TSH  Result Value Ref Range   TSH 1.61 0.35 - 4.50 uIU/mL   Letter to pt

## 2018-07-21 ENCOUNTER — Encounter: Payer: Self-pay | Admitting: Family Medicine

## 2018-07-21 ENCOUNTER — Ambulatory Visit (INDEPENDENT_AMBULATORY_CARE_PROVIDER_SITE_OTHER): Payer: Medicare Other | Admitting: Family Medicine

## 2018-07-21 VITALS — BP 136/85 | HR 41 | Temp 97.7°F | Resp 16 | Ht 62.0 in | Wt 142.4 lb

## 2018-07-21 DIAGNOSIS — Z23 Encounter for immunization: Secondary | ICD-10-CM | POA: Diagnosis not present

## 2018-07-21 DIAGNOSIS — Z0000 Encounter for general adult medical examination without abnormal findings: Secondary | ICD-10-CM

## 2018-07-21 DIAGNOSIS — L719 Rosacea, unspecified: Secondary | ICD-10-CM | POA: Diagnosis not present

## 2018-07-21 DIAGNOSIS — Z131 Encounter for screening for diabetes mellitus: Secondary | ICD-10-CM

## 2018-07-21 DIAGNOSIS — R001 Bradycardia, unspecified: Secondary | ICD-10-CM | POA: Diagnosis not present

## 2018-07-21 DIAGNOSIS — E785 Hyperlipidemia, unspecified: Secondary | ICD-10-CM | POA: Diagnosis not present

## 2018-07-21 DIAGNOSIS — M8580 Other specified disorders of bone density and structure, unspecified site: Secondary | ICD-10-CM

## 2018-07-21 LAB — CBC
HCT: 36.6 % (ref 36.0–46.0)
Hemoglobin: 12.3 g/dL (ref 12.0–15.0)
MCHC: 33.6 g/dL (ref 30.0–36.0)
MCV: 92.4 fl (ref 78.0–100.0)
Platelets: 207 10*3/uL (ref 150.0–400.0)
RBC: 3.96 Mil/uL (ref 3.87–5.11)
RDW: 12.4 % (ref 11.5–15.5)
WBC: 4.8 10*3/uL (ref 4.0–10.5)

## 2018-07-21 LAB — COMPREHENSIVE METABOLIC PANEL
ALBUMIN: 3.9 g/dL (ref 3.5–5.2)
ALT: 13 U/L (ref 0–35)
AST: 20 U/L (ref 0–37)
Alkaline Phosphatase: 38 U/L — ABNORMAL LOW (ref 39–117)
BILIRUBIN TOTAL: 0.6 mg/dL (ref 0.2–1.2)
BUN: 22 mg/dL (ref 6–23)
CHLORIDE: 106 meq/L (ref 96–112)
CO2: 30 meq/L (ref 19–32)
Calcium: 8.6 mg/dL (ref 8.4–10.5)
Creatinine, Ser: 0.82 mg/dL (ref 0.40–1.20)
GFR: 73.25 mL/min (ref >60.00)
Glucose, Bld: 93 mg/dL (ref 70–99)
Potassium: 3.7 mEq/L (ref 3.5–5.1)
Sodium: 141 mEq/L (ref 135–145)
TOTAL PROTEIN: 5.8 g/dL — AB (ref 6.0–8.3)

## 2018-07-21 LAB — LIPID PANEL
Cholesterol: 195 mg/dL (ref 0–200)
HDL: 77 mg/dL (ref >39.00)
LDL CALC: 108 mg/dL — AB (ref 0–99)
NONHDL: 118.19
Total CHOL/HDL Ratio: 3
Triglycerides: 50 mg/dL (ref 0.0–149.0)
VLDL: 10 mg/dL (ref 0.0–40.0)

## 2018-07-21 LAB — TSH: TSH: 1.61 u[IU]/mL (ref 0.35–4.50)

## 2018-07-21 LAB — HEMOGLOBIN A1C: Hgb A1c MFr Bld: 5.8 % (ref 4.6–6.5)

## 2018-07-21 NOTE — Patient Instructions (Addendum)
It was good to see you today I will be in touch with your labs, and we will do a bone density in one year  I will set you up to see cardiology to evaluate your heart   If you start to have any symptoms or other concerns please do alert me!   Health Maintenance, Female Adopting a healthy lifestyle and getting preventive care can go a long way to promote health and wellness. Talk with your health care provider about what schedule of regular examinations is right for you. This is a good chance for you to check in with your provider about disease prevention and staying healthy. In between checkups, there are plenty of things you can do on your own. Experts have done a lot of research about which lifestyle changes and preventive measures are most likely to keep you healthy. Ask your health care provider for more information. Weight and diet Eat a healthy diet  Be sure to include plenty of vegetables, fruits, low-fat dairy products, and lean protein.  Do not eat a lot of foods high in solid fats, added sugars, or salt.  Get regular exercise. This is one of the most important things you can do for your health. ? Most adults should exercise for at least 150 minutes each week. The exercise should increase your heart rate and make you sweat (moderate-intensity exercise). ? Most adults should also do strengthening exercises at least twice a week. This is in addition to the moderate-intensity exercise.  Maintain a healthy weight  Body mass index (BMI) is a measurement that can be used to identify possible weight problems. It estimates body fat based on height and weight. Your health care provider can help determine your BMI and help you achieve or maintain a healthy weight.  For females 61 years of age and older: ? A BMI below 18.5 is considered underweight. ? A BMI of 18.5 to 24.9 is normal. ? A BMI of 25 to 29.9 is considered overweight. ? A BMI of 30 and above is considered obese.  Watch levels  of cholesterol and blood lipids  You should start having your blood tested for lipids and cholesterol at 70 years of age, then have this test every 5 years.  You may need to have your cholesterol levels checked more often if: ? Your lipid or cholesterol levels are high. ? You are older than 70 years of age. ? You are at high risk for heart disease.  Cancer screening Lung Cancer  Lung cancer screening is recommended for adults 34-34 years old who are at high risk for lung cancer because of a history of smoking.  A yearly low-dose CT scan of the lungs is recommended for people who: ? Currently smoke. ? Have quit within the past 15 years. ? Have at least a 30-pack-year history of smoking. A pack year is smoking an average of one pack of cigarettes a day for 1 year.  Yearly screening should continue until it has been 15 years since you quit.  Yearly screening should stop if you develop a health problem that would prevent you from having lung cancer treatment.  Breast Cancer  Practice breast self-awareness. This means understanding how your breasts normally appear and feel.  It also means doing regular breast self-exams. Let your health care provider know about any changes, no matter how small.  If you are in your 20s or 30s, you should have a clinical breast exam (CBE) by a health care provider every  1-3 years as part of a regular health exam.  If you are 66 or older, have a CBE every year. Also consider having a breast X-ray (mammogram) every year.  If you have a family history of breast cancer, talk to your health care provider about genetic screening.  If you are at high risk for breast cancer, talk to your health care provider about having an MRI and a mammogram every year.  Breast cancer gene (BRCA) assessment is recommended for women who have family members with BRCA-related cancers. BRCA-related cancers include: ? Breast. ? Ovarian. ? Tubal. ? Peritoneal  cancers.  Results of the assessment will determine the need for genetic counseling and BRCA1 and BRCA2 testing.  Cervical Cancer Your health care provider may recommend that you be screened regularly for cancer of the pelvic organs (ovaries, uterus, and vagina). This screening involves a pelvic examination, including checking for microscopic changes to the surface of your cervix (Pap test). You may be encouraged to have this screening done every 3 years, beginning at age 62.  For women ages 30-65, health care providers may recommend pelvic exams and Pap testing every 3 years, or they may recommend the Pap and pelvic exam, combined with testing for human papilloma virus (HPV), every 5 years. Some types of HPV increase your risk of cervical cancer. Testing for HPV may also be done on women of any age with unclear Pap test results.  Other health care providers may not recommend any screening for nonpregnant women who are considered low risk for pelvic cancer and who do not have symptoms. Ask your health care provider if a screening pelvic exam is right for you.  If you have had past treatment for cervical cancer or a condition that could lead to cancer, you need Pap tests and screening for cancer for at least 20 years after your treatment. If Pap tests have been discontinued, your risk factors (such as having a new sexual partner) need to be reassessed to determine if screening should resume. Some women have medical problems that increase the chance of getting cervical cancer. In these cases, your health care provider may recommend more frequent screening and Pap tests.  Colorectal Cancer  This type of cancer can be detected and often prevented.  Routine colorectal cancer screening usually begins at 70 years of age and continues through 70 years of age.  Your health care provider may recommend screening at an earlier age if you have risk factors for colon cancer.  Your health care provider may also  recommend using home test kits to check for hidden blood in the stool.  A small camera at the end of a tube can be used to examine your colon directly (sigmoidoscopy or colonoscopy). This is done to check for the earliest forms of colorectal cancer.  Routine screening usually begins at age 6.  Direct examination of the colon should be repeated every 5-10 years through 70 years of age. However, you may need to be screened more often if early forms of precancerous polyps or small growths are found.  Skin Cancer  Check your skin from head to toe regularly.  Tell your health care provider about any new moles or changes in moles, especially if there is a change in a mole's shape or color.  Also tell your health care provider if you have a mole that is larger than the size of a pencil eraser.  Always use sunscreen. Apply sunscreen liberally and repeatedly throughout the day.  Protect  yourself by wearing long sleeves, pants, a wide-brimmed hat, and sunglasses whenever you are outside.  Heart disease, diabetes, and high blood pressure  High blood pressure causes heart disease and increases the risk of stroke. High blood pressure is more likely to develop in: ? People who have blood pressure in the high end of the normal range (130-139/85-89 mm Hg). ? People who are overweight or obese. ? People who are African American.  If you are 51-43 years of age, have your blood pressure checked every 3-5 years. If you are 60 years of age or older, have your blood pressure checked every year. You should have your blood pressure measured twice-once when you are at a hospital or clinic, and once when you are not at a hospital or clinic. Record the average of the two measurements. To check your blood pressure when you are not at a hospital or clinic, you can use: ? An automated blood pressure machine at a pharmacy. ? A home blood pressure monitor.  If you are between 85 years and 59 years old, ask your  health care provider if you should take aspirin to prevent strokes.  Have regular diabetes screenings. This involves taking a blood sample to check your fasting blood sugar level. ? If you are at a normal weight and have a low risk for diabetes, have this test once every three years after 70 years of age. ? If you are overweight and have a high risk for diabetes, consider being tested at a younger age or more often. Preventing infection Hepatitis B  If you have a higher risk for hepatitis B, you should be screened for this virus. You are considered at high risk for hepatitis B if: ? You were born in a country where hepatitis B is common. Ask your health care provider which countries are considered high risk. ? Your parents were born in a high-risk country, and you have not been immunized against hepatitis B (hepatitis B vaccine). ? You have HIV or AIDS. ? You use needles to inject street drugs. ? You live with someone who has hepatitis B. ? You have had sex with someone who has hepatitis B. ? You get hemodialysis treatment. ? You take certain medicines for conditions, including cancer, organ transplantation, and autoimmune conditions.  Hepatitis C  Blood testing is recommended for: ? Everyone born from [birth year] through 1963. ? Anyone with known risk factors for hepatitis C.  Sexually transmitted infections (STIs)  You should be screened for sexually transmitted infections (STIs) including gonorrhea and chlamydia if: ? You are sexually active and are younger than 70 years of age. ? You are older than 70 years of age and your health care provider tells you that you are at risk for this type of infection. ? Your sexual activity has changed since you were last screened and you are at an increased risk for chlamydia or gonorrhea. Ask your health care provider if you are at risk.  If you do not have HIV, but are at risk, it may be recommended that you take a prescription medicine daily to  prevent HIV infection. This is called pre-exposure prophylaxis (PrEP). You are considered at risk if: ? You are sexually active and do not regularly use condoms or know the HIV status of your partner(s). ? You take drugs by injection. ? You are sexually active with a partner who has HIV.  Talk with your health care provider about whether you are at high risk of being  infected with HIV. If you choose to begin PrEP, you should first be tested for HIV. You should then be tested every 3 months for as long as you are taking PrEP. Pregnancy  If you are premenopausal and you may become pregnant, ask your health care provider about preconception counseling.  If you may become pregnant, take 400 to 800 micrograms (mcg) of folic acid every day.  If you want to prevent pregnancy, talk to your health care provider about birth control (contraception). Osteoporosis and menopause  Osteoporosis is a disease in which the bones lose minerals and strength with aging. This can result in serious bone fractures. Your risk for osteoporosis can be identified using a bone density scan.  If you are 31 years of age or older, or if you are at risk for osteoporosis and fractures, ask your health care provider if you should be screened.  Ask your health care provider whether you should take a calcium or vitamin D supplement to lower your risk for osteoporosis.  Menopause may have certain physical symptoms and risks.  Hormone replacement therapy may reduce some of these symptoms and risks. Talk to your health care provider about whether hormone replacement therapy is right for you. Follow these instructions at home:  Schedule regular health, dental, and eye exams.  Stay current with your immunizations.  Do not use any tobacco products including cigarettes, chewing tobacco, or electronic cigarettes.  If you are pregnant, do not drink alcohol.  If you are breastfeeding, limit how much and how often you drink  alcohol.  Limit alcohol intake to no more than 1 drink per day for nonpregnant women. One drink equals 12 ounces of beer, 5 ounces of wine, or 1 ounces of hard liquor.  Do not use street drugs.  Do not share needles.  Ask your health care provider for help if you need support or information about quitting drugs.  Tell your health care provider if you often feel depressed.  Tell your health care provider if you have ever been abused or do not feel safe at home. This information is not intended to replace advice given to you by your health care provider. Make sure you discuss any questions you have with your health care provider. Document Released: 04/23/2011 Document Revised: 03/15/2016 Document Reviewed: 07/12/2015 Elsevier Interactive Patient Education  [employer].

## 2018-07-23 ENCOUNTER — Encounter: Payer: Self-pay | Admitting: Cardiology

## 2018-07-23 ENCOUNTER — Ambulatory Visit: Payer: Medicare Other | Admitting: Cardiology

## 2018-07-23 ENCOUNTER — Other Ambulatory Visit: Payer: Self-pay | Admitting: Family Medicine

## 2018-07-23 VITALS — BP 128/62 | HR 68 | Ht 62.0 in | Wt 139.1 lb

## 2018-07-23 DIAGNOSIS — M85859 Other specified disorders of bone density and structure, unspecified thigh: Secondary | ICD-10-CM

## 2018-07-23 DIAGNOSIS — R002 Palpitations: Secondary | ICD-10-CM | POA: Diagnosis not present

## 2018-07-23 DIAGNOSIS — I499 Cardiac arrhythmia, unspecified: Secondary | ICD-10-CM

## 2018-07-23 DIAGNOSIS — I498 Other specified cardiac arrhythmias: Secondary | ICD-10-CM

## 2018-07-23 DIAGNOSIS — R011 Cardiac murmur, unspecified: Secondary | ICD-10-CM

## 2018-07-23 DIAGNOSIS — I493 Ventricular premature depolarization: Secondary | ICD-10-CM

## 2018-07-23 DIAGNOSIS — Z7189 Other specified counseling: Secondary | ICD-10-CM | POA: Diagnosis not present

## 2018-07-23 NOTE — Patient Instructions (Signed)
Medication Instructions: Your physician recommends that you continue on your current medications as directed.    If you need a refill on your cardiac medications before your next appointment, please call your pharmacy.   Labwork: None  Procedures/Testing: Your physician has requested that you have an echocardiogram. Echocardiography is a painless test that uses sound waves to create images of your heart. It provides your doctor with information about the size and shape of your heart and how well your heart's chambers and valves are working. This procedure takes approximately one hour. There are no restrictions for this procedure. [facility]  Our physician has recommended that you wear a 7 DAY ZIO-PATCH monitor. The Zio patch cardiac monitor continuously records heart rhythm data for up to 14 days, this is for patients being evaluated for multiple types heart rhythms. For the first 24 hours post application, please avoid getting the Zio monitor wet in the shower or by excessive sweating during exercise. After that, feel free to carry on with regular activities. Keep soaps and lotions away from the ZIO XT Patch.   This will be placed at [place]: Your physician wants you to follow-up on 08/22/18 with Dr. Harrell Gave.    Special Instructions:    Thank you for choosing [facility]!!

## 2018-07-23 NOTE — Progress Notes (Signed)
Cardiology Office Note:    Date:  07/23/2018   ID:  Marilyn Hess, DOB December 26, 1945, MRN [id number]  PCP:  Darreld Mclean, MD  Cardiologist:  Buford Dresser, MD PhD  Referring MD: Darreld Mclean, MD   CC: abnormal ECG/heart rate  History of Present Illness:    Marilyn Hess is a 72 y.o. female with a history of reported valve abnormality who is seen as a new consult at the request of Copland, Gay Filler, MD for the evaluation and management of bradycardia and palpitations. She was recently seeb by Dr. Lorelei Pont on 07/21/18. She noted an incidental heart rate in the 50s recently at home, and her heart rate in the office was noted to be 41. ECG noted bigeminy at 71 bpm.  Patient concerns today: She was told she had a bad valve as a child. She also has noted irregular heart beats and leg swelling recently. She lives in a house without air conditioning, so she attributed it to the heat. Occasional dizzyness. Also has history of ear issues, gets dizzy with motion (cruise) and when wind blows in her. Has recently felt more fatigued as well. Limited exercise tolerance. She used someone else's smart watch and found her heart rate to be in the 50s. She has felt occasional irregularities but no frank chest discomfort. She has never been told that she has an irregular heart rhythm before.  Arrhythmia/palpitaitons: -Initial onset: just recently, a month or so.  -Frequency: infrequent -Duration: very brief -Triggers: feels worse when she has reactive hypoglycemia but otherwise none -Aggravating/alleviating factors: none -Syncope/near syncope: almost passed out, "sat on the toilet for too long", felt very lightheaded when she got up. No full syncope. -Prior cardiac history: abnormal valve, echo "many years ago" as a child. Does not think it was her mitral valve. -Prior ECG: ventricular bigeminy -Prior workup: none -Prior treatment: none -Possible medication interactions:  none -Caffeine: a lot; a quart of coffee in the AM, sometimes tea.  -Alcohol: none -Tobacco: never -OTC supplements: only as listed -Comorbidities: none -Exercise level: recently had some knee issues, but used to walk more. Starting to re-increase her activity -Labs: TSH, kidney function/electrolytes, CBC all normal. -Cardiac ROS: no chest pain, no PND, no orthopnea, occasional intermittent LE edema. -Family history: she is adopted but knows that her biological father and brother had strokes. They had poor lifestyle with smoking and alcohol.  Past Medical History:  Diagnosis Date  . Breast lump   . Hyperlipidemia     Past Surgical History:  Procedure Laterality Date  . CARPAL TUNNEL RELEASE  11-2014  . TOOTH EXTRACTION  07-2011    Current Medications: Current Outpatient Medications on File Prior to Visit  Medication Sig  . alendronate (FOSAMAX) 70 MG tablet Take 1 tablet (70 mg total) by mouth every 7 (seven) days. Take with a full glass of water on an empty stomach.  . Ascorbic Acid (VITAMIN C PO) Take by mouth as needed. Reported on 10/05/2015  . aspirin EC 81 MG tablet Take 81 mg by mouth daily.  . calcium carbonate (OS-CAL - DOSED IN MG OF ELEMENTAL CALCIUM) 1250 (500 CA) MG tablet Take 1 tablet by mouth. Reported on 10/05/2015  . calcium-vitamin D (OSCAL WITH D) 500-200 MG-UNIT tablet Take 1 tablet by mouth.  . metroNIDAZOLE (METROGEL) 1 % gel Apply 1 application topically daily. Reported on 10/05/2015   No current facility-administered medications on file prior to visit.      Allergies:   Cinnamon  Social History   Socioeconomic History  . Marital status: Divorced    Spouse name: Not on file  . Number of children: Not on file  . Years of education: Not on file  . Highest education level: Not on file  Occupational History  . Not on file  Social Needs  . Financial resource strain: Not on file  . Food insecurity:    Worry: Not on file    Inability: Not on file   . Transportation needs:    Medical: Not on file    Non-medical: Not on file  Tobacco Use  . Smoking status: Never Smoker  . Smokeless tobacco: Never Used  Substance and Sexual Activity  . Alcohol use: No    Alcohol/week: 0.0 standard drinks  . Drug use: No  . Sexual activity: Never  Lifestyle  . Physical activity:    Days per week: Not on file    Minutes per session: Not on file  . Stress: Not on file  Relationships  . Social connections:    Talks on phone: Not on file    Gets together: Not on file    Attends religious service: Not on file    Active member of club or organization: Not on file    Attends meetings of clubs or organizations: Not on file    Relationship status: Not on file  Other Topics Concern  . Not on file  Social History Narrative  . Not on file     Family History: [name] history includes Alcohol abuse in her father; Alzheimer's disease in her mother; Hypertension in her brother and father; Stroke in her father. There is no history of Colon cancer. She was adopted.  ROS:   Please see the history of present illness.  Additional pertinent ROS:  Constitutional: Negative for chills, fever, night sweats, unintentional weight loss. Has had more fatigue. HENT: Negative for ear pain and hearing loss.   Eyes: Negative for loss of vision and eye pain.  Respiratory: Negative for cough, sputum, wheezing.  Does feel more short of breath with exertion. Cardiovascular: Positive for palpitations. Negative for chest pain, PND, orthopnea, Has intermittent mild lower extremity edema.  Gastrointestinal: Negative for abdominal pain, melena, and hematochezia.  Genitourinary: Negative for dysuria and hematuria.  Musculoskeletal: Negative for falls and myalgias.  Skin: Negative for itching and rash.  Neurological: Negative for focal weakness, focal sensory changes and loss of consciousness. Has a history of headaches, dizziness. Endo/Heme/Allergies: Does  bruise/bleed easily.    EKGs/Labs/Other Studies Reviewed:    The following studies were reviewed today: No cardiac studies available to review.  EKG:  EKG is ordered today.  The ekg ordered today demonstrates sinus rhythm with ventricular trigeminy  Recent Labs: 07/21/2018: ALT 13; BUN 22; Creatinine, Ser 0.82; Hemoglobin 12.3; Platelets 207.0; Potassium 3.7; Sodium 141; TSH 1.61  Recent Lipid Panel    Component Value Date/Time   CHOL 195 07/21/2018 0914   TRIG 50.0 07/21/2018 0914   TRIG 54 08/26/2006 0925   HDL 77.00 07/21/2018 0914   CHOLHDL 3 07/21/2018 0914   VLDL 10.0 07/21/2018 0914   LDLCALC 108 (H) 07/21/2018 0914   LDLDIRECT 118.4 07/10/2012 0849    Physical Exam:    VS:  BP 128/62   Pulse 68   Ht 5\' 2"  (1.575 m)   Wt 139 lb 1.9 oz (63.1 kg)   BMI 25.45 kg/m     Wt Readings from Last 3 Encounters:  07/23/18 139 lb 1.9  oz (63.1 kg)  07/21/18 142 lb 6.4 oz (64.6 kg)  04/11/18 143 lb (64.9 kg)     GEN: Well nourished, well developed in no acute distress HEENT: Normal NECK: No JVD; No carotid bruits LYMPHATICS: No lymphadenopathy CARDIAC: regular rhythm, normal S1 and S2, no rubs, gallops. 1/6 systolic ejection murmur at sternal border. Radial and DP pulses 2+ bilaterally. RESPIRATORY:  Clear to auscultation without rales, wheezing or rhonchi  ABDOMEN: Soft, non-tender, non-distended MUSCULOSKELETAL:  No edema; No deformity  SKIN: Warm and dry NEUROLOGIC:  Alert and oriented x 3 PSYCHIATRIC:  Normal affect   ASSESSMENT:    1. Heart palpitations   2. Ventricular bigeminy   3. Counseling on health promotion and disease prevention   4. PVC (premature ventricular contraction)   5. Murmur, cardiac    PLAN:    1. Palpitations, bigeminy: appears as bradycardia on exam (and likely on personal activity trackers). Was bigeminy on PCP ECG, in trigeminy today. Unclear duration or etiology. Will start with 7 day monitor as frequency of occurrence is somewhat  unclear but thought to be at least every other day. Will be helpful to know PVC burden, heart rate pattern, and presence of any other arrhythmia. Will start with monitor and echocardiogram. Based on results, would pursue treadmill nuclear scan if testing abnormal. Could consider beta blocker depending on results vs. Referral for ablation. Caffeine is only clear possible risk factor, though she does have an unclear cardiac history as a child. -monitor, 7 day zio -echocardiogram  2. Prevention: The 10-year ASCVD risk score Mikey Bussing DC Jr., et al., 2013) is: 8.8%   Values used to calculate the score:     Age: 67 years     Sex: Female     Is Non-Hispanic African American: No     Diabetic: No     Tobacco smoker: No     Systolic Blood Pressure: 563 mmHg     Is BP treated: No     HDL Cholesterol: 77 mg/dL     Total Cholesterol: 195 mg/dL  Prevention: -recommend heart healthy/Mediterranean diet, with whole grains, fruits, vegetable, fish, lean meats, nuts, and olive oil. Limit salt. -recommend moderate walking, 3-5 times/week for 30-50 minutes each session. Aim for at least 150 minutes.week. Goal should be pace of 3 miles/hours, or walking 1.5 miles in 30 minutes -recommend avoidance of tobacco products. Avoid excess alcohol. -Additional risk factor control:  -Diabetes: A1c is 5.8  -Lipids: HDL 77, LDL 108  -Blood pressure control adequate  -Weight: BMI 25  Sleep apnea: she thinks she might snore. Follow.  Plan for follow up: 08/22/18 to discuss results and next steps.  Medication Adjustments/Labs and Tests Ordered: Current medicines are reviewed at length with the patient today.  Concerns regarding medicines are outlined above.  Orders Placed This Encounter  Procedures  . LONG TERM MONITOR (3-14 DAYS)  . EKG 12-Lead  . ECHOCARDIOGRAM COMPLETE   No orders of the defined types were placed in this encounter.   Patient Instructions  Medication Instructions: Your physician recommends that  you continue on your current medications as directed.    If you need a refill on your cardiac medications before your next appointment, please call your pharmacy.   Labwork: None  Procedures/Testing: Your physician has requested that you have an echocardiogram. Echocardiography is a painless test that uses sound waves to create images of your heart. It provides your doctor with information about the size and shape of your heart and how well  your heart's chambers and valves are working. This procedure takes approximately one hour. There are no restrictions for this procedure. [facility]  Our physician has recommended that you wear a 7 DAY ZIO-PATCH monitor. The Zio patch cardiac monitor continuously records heart rhythm data for up to 14 days, this is for patients being evaluated for multiple types heart rhythms. For the first 24 hours post application, please avoid getting the Zio monitor wet in the shower or by excessive sweating during exercise. After that, feel free to carry on with regular activities. Keep soaps and lotions away from the ZIO XT Patch.   This will be placed at [place]: Your physician wants you to follow-up on 08/22/18 with Dr. Harrell Gave.    Special Instructions:    Thank you for choosing [facility] at [facility]!!       Signed, Buford Dresser, MD PhD 07/23/2018 1:12 PM    [facility]

## 2018-07-28 ENCOUNTER — Ambulatory Visit: Payer: Medicare Other

## 2018-07-28 ENCOUNTER — Ambulatory Visit (HOSPITAL_BASED_OUTPATIENT_CLINIC_OR_DEPARTMENT_OTHER)
Admission: RE | Admit: 2018-07-28 | Discharge: 2018-07-28 | Disposition: A | Discharge status: Home / Self Care | Payer: Medicare Other | Source: Ambulatory Visit | Attending: Cardiology | Admitting: Cardiology

## 2018-07-28 DIAGNOSIS — R008 Other abnormalities of heart beat: Secondary | ICD-10-CM | POA: Insufficient documentation

## 2018-07-28 DIAGNOSIS — I361 Nonrheumatic tricuspid (valve) insufficiency: Secondary | ICD-10-CM | POA: Insufficient documentation

## 2018-07-28 DIAGNOSIS — R002 Palpitations: Secondary | ICD-10-CM

## 2018-07-28 DIAGNOSIS — I493 Ventricular premature depolarization: Secondary | ICD-10-CM | POA: Insufficient documentation

## 2018-07-28 NOTE — Progress Notes (Signed)
  Echocardiogram 2D Echocardiogram has been performed.  Geni Skorupski T Elsworth Ledin 07/28/2018, 1:28 PM

## 2018-07-31 ENCOUNTER — Telehealth: Payer: Self-pay

## 2018-07-31 NOTE — Telephone Encounter (Signed)
Left message to call back  

## 2018-07-31 NOTE — Telephone Encounter (Signed)
The patient has been notified of the result and verbalized understanding.  All questions (if any) were answered. Meryl Crutch, RN 07/31/2018 12:01 PM

## 2018-07-31 NOTE — Telephone Encounter (Signed)
-----   Message from Buford Dresser, MD sent at 07/30/2018  3:39 PM EDT ----- Normal function, no significant valve disease. No clear reason for PVCs based on the anatomy of the heart.

## 2018-07-31 NOTE — Telephone Encounter (Signed)
Follow up    Patient is returning call for lab work.

## 2018-08-22 ENCOUNTER — Ambulatory Visit: Payer: Medicare Other | Admitting: Cardiology

## 2018-08-22 ENCOUNTER — Encounter: Payer: Self-pay | Admitting: Family Medicine

## 2018-08-22 ENCOUNTER — Encounter: Payer: Self-pay | Admitting: Cardiology

## 2018-08-22 ENCOUNTER — Ambulatory Visit: Payer: Medicare Other | Admitting: Family Medicine

## 2018-08-22 VITALS — BP 136/72 | HR 81 | Ht 62.0 in | Wt 138.1 lb

## 2018-08-22 VITALS — BP 142/76 | HR 51 | Temp 97.4°F | Wt 139.3 lb

## 2018-08-22 DIAGNOSIS — R011 Cardiac murmur, unspecified: Secondary | ICD-10-CM | POA: Diagnosis not present

## 2018-08-22 DIAGNOSIS — Z7189 Other specified counseling: Secondary | ICD-10-CM

## 2018-08-22 DIAGNOSIS — I493 Ventricular premature depolarization: Secondary | ICD-10-CM | POA: Diagnosis not present

## 2018-08-22 DIAGNOSIS — R008 Other abnormalities of heart beat: Secondary | ICD-10-CM

## 2018-08-22 DIAGNOSIS — S6000XA Contusion of unspecified finger without damage to nail, initial encounter: Secondary | ICD-10-CM | POA: Diagnosis not present

## 2018-08-22 DIAGNOSIS — S61233A Puncture wound without foreign body of left middle finger without damage to nail, initial encounter: Secondary | ICD-10-CM | POA: Diagnosis not present

## 2018-08-22 MED ORDER — CEPHALEXIN 500 MG PO CAPS: 500.0000 mg | ORAL_CAPSULE | Freq: Two times a day (BID) | ORAL | 0 refills | Status: AC

## 2018-08-22 NOTE — Patient Instructions (Signed)
Medication Instructions:  Your Physician recommend you continue on your current medication as directed.    If you need a refill on your cardiac medications before your next appointment, please call your pharmacy.   Lab work: None   Testing/Procedures: Your physician has requested that you have en exercise stress myoview in 2 months. For further information please visit [email or web address]. Please follow instruction sheet, as given. [place]  Follow-Up: Your physician recommends that you schedule a follow-up appointment in 3 months with Dr. Harrell Gave   Any Other Special Instructions Will Be Listed Below (If Applicable).

## 2018-08-22 NOTE — Progress Notes (Signed)
Cardiology Office Note:    Date:  08/22/2018   ID:  Marilyn Hess, DOB [birth year], MRN [id number]  PCP:  Marilyn Mclean, MD  Cardiologist:  Marilyn Dresser, MD PhD  Referring MD: Marilyn Mclean, MD   CC: abnormal ECG/heart rate  History of Present Illness:    Marilyn Hess is a 72 y.o. female with a history of reported valve abnormality who is seen in follow up for the evaluation and management of bradycardia and palpitations. She was recently seeb by Dr. Lorelei Hess on 07/21/18. She noted an incidental heart rate in the 50s recently at home, and her heart rate in the office was noted to be 41. ECG noted bigeminy at 71 bpm. Her initial consult with me was on 07/23/18, and echo/monitor ordered at that visit. She noted infrequent, brief palpitations at that time.   Patient concerns today: left middle finger with swelling, area of discoloration over tip where she nicked her finger several days ago. Is rapidly expanding. No fevers/chills. Has appointment today to be evaluated for this.   From a cardiac standpoint, she has been working on weaning caffeine, has had some mild headaches with this. Occasional lightheadedness, no syncope. Still having episodes when she feels hard/slow or fast heart beats, but they continue to be self limited episodes. She has some mild shortness of breath with these, but she just pauses and waits until the episode is done and then returns to her activity.  Arrhythmia/palpitaitons: -Initial onset: around 06/2018.  -Frequency: infrequent -Duration: very brief -Triggers: feels worse when she has reactive hypoglycemia but otherwise none -Aggravating/alleviating factors: none -Syncope/near syncope: almost passed out, "sat on the toilet for too long", felt very lightheaded when she got up. No full syncope. -Prior cardiac history: abnormal valve, echo "many years ago" as a child. Does not think it was her mitral valve. -Prior ECG: ventricular  bigeminy -Prior workup: none -Prior treatment: none -Possible medication interactions: none -Caffeine: a lot; a quart of coffee in the AM, sometimes tea.  -Alcohol: none -Tobacco: never -OTC supplements: only as listed -Comorbidities: none -Exercise level: recently had some knee issues, but used to walk more. Starting to re-increase her activity -Labs: TSH, kidney function/electrolytes, CBC all normal. -Cardiac ROS: no chest pain, no PND, no orthopnea, occasional intermittent LE edema. -Family history: she is adopted but knows that her biological father and brother had strokes. They had poor lifestyle with smoking and alcohol.  Past Medical History:  Diagnosis Date  . Breast lump   . Hyperlipidemia     Past Surgical History:  Procedure Laterality Date  . CARPAL TUNNEL RELEASE  11-2014  . TOOTH EXTRACTION  07-2011    Current Medications: Current Outpatient Medications on File Prior to Visit  Medication Sig  . alendronate (FOSAMAX) 70 MG tablet TAKE 1 TABLET BY MOUTH EVERY 7 DAYS. TAKE WITH A FULL GLASS OF WATER ON AN EMPTY STOMACH  . aspirin EC 81 MG tablet Take 81 mg by mouth daily.  . calcium carbonate (OS-CAL - DOSED IN MG OF ELEMENTAL CALCIUM) 1250 (500 CA) MG tablet Take 1 tablet by mouth. Reported on 10/05/2015  . calcium-vitamin D (OSCAL WITH D) 500-200 MG-UNIT tablet Take 1 tablet by mouth.  . metroNIDAZOLE (METROGEL) 1 % gel Apply 1 application topically daily. Reported on 10/05/2015 (Patient taking differently: Apply 1 application topically as needed. Reported on 10/05/2015)   No current facility-administered medications on file prior to visit.      Allergies:   Cinnamon   Social  History   Socioeconomic History  . Marital status: Divorced    Spouse name: Not on file  . Number of children: Not on file  . Years of education: Not on file  . Highest education level: Not on file  Occupational History  . Not on file  Social Needs  . Financial resource strain: Not  on file  . Food insecurity:    Worry: Not on file    Inability: Not on file  . Transportation needs:    Medical: Not on file    Non-medical: Not on file  Tobacco Use  . Smoking status: Never Smoker  . Smokeless tobacco: Never Used  Substance and Sexual Activity  . Alcohol use: No    Alcohol/week: 0.0 standard drinks  . Drug use: No  . Sexual activity: Never  Lifestyle  . Physical activity:    Days per week: Not on file    Minutes per session: Not on file  . Stress: Not on file  Relationships  . Social connections:    Talks on phone: Not on file    Gets together: Not on file    Attends religious service: Not on file    Active member of club or organization: Not on file    Attends meetings of clubs or organizations: Not on file    Relationship status: Not on file  Other Topics Concern  . Not on file  Social History Narrative  . Not on file     Family History: [name] history includes Alcohol abuse in her father; Alzheimer's disease in her mother; Hypertension in her brother and father; Stroke in her father. There is no history of Colon cancer. She was adopted.  ROS:   Please see the history of present illness.  Additional pertinent ROS:  Constitutional: Negative for chills, fever, night sweats, unintentional weight loss. Has had more fatigue. HENT: Negative for ear pain and hearing loss.   Eyes: Negative for loss of vision and eye pain.  Respiratory: Negative for cough, sputum, wheezing.  Does feel more short of breath with exertion. Cardiovascular: Positive for palpitations. Negative for chest pain, PND, orthopnea, Has intermittent mild lower extremity edema.  Gastrointestinal: Negative for abdominal pain, melena, and hematochezia.  Genitourinary: Negative for dysuria and hematuria.  Musculoskeletal: Negative for falls and myalgias.  Skin: Negative for itching and rash. Left third fingertip with dark discoloration surrounded by edema and erythema. Pulse  intact. Neurological: Negative for focal weakness, focal sensory changes and loss of consciousness. Has a history of headaches, dizziness. Endo/Heme/Allergies: Does bruise/bleed easily.    EKGs/Labs/Other Studies Reviewed:    The following studies were reviewed today: Echo 07/28/18 Study Conclusions  - Left ventricle: The cavity size was normal. Systolic function was   normal. The estimated ejection fraction was in the range of 55%   to 60%. Wall motion was normal; there were no regional wall   motion abnormalities. Left ventricular diastolic function   parameters were normal. - Aortic valve: Valve area (VTI): 1.63 cm^2. Valve area (Vmax):   1.48 cm^2. Valve area (Vmean): 1.61 cm^2. - Left atrium: The atrium was moderately to severely dilated. - Right atrium: The atrium was moderately dilated. - Tricuspid valve: Peak RV-RA gradient (S): 18 mm Hg. - Pulmonary arteries: PA peak pressure: 33 mm Hg (S).  Impressions:  - Normal left and right ventricular chamber size and function, with   LVEF 55-60%. No hemodynamically significant valve disease.   Biatrial enlargement. RVSP approximately 27-32 mmHg.  Monitor 08/07/18 4 days, 1 hour of interpretable data on [facility]. No patient triggered events, and one auto recorded event of SVT. No sustained VT, pauses, or high degree AV block noted.  Patient had a min HR of 45 bpm, max HR of 141 bpm, and avg HR of 74 bpm. Predominant underlying rhythm was Sinus Rhythm. Intermittent Bundle Branch Block was present.   1 run of Supraventricular Tachycardia occurred lasting 6 beats with a max rate of 141 bpm (avg 134 bpm). Isolated SVEs were rare (<1.0%), SVE Couplets were rare (<1.0%), and SVE Triplets were rare (<1.0%). Isolated VEs were frequent (18.0%, [id number]), VE Couplets were rare (<1.0%, 452), and VE Triplets were rare (<1.0%, 7). Ventricular Bigeminy and Trigeminy were present.  EKG:  The ekg ordered previously demonstrates sinus rhythm with  ventricular trigeminy  Recent Labs: 07/21/2018: ALT 13; BUN 22; Creatinine, Ser 0.82; Hemoglobin 12.3; Platelets 207.0; Potassium 3.7; Sodium 141; TSH 1.61  Recent Lipid Panel    Component Value Date/Time   CHOL 195 07/21/2018 0914   TRIG 50.0 07/21/2018 0914   TRIG 54 08/26/2006 0925   HDL 77.00 07/21/2018 0914   CHOLHDL 3 07/21/2018 0914   VLDL 10.0 07/21/2018 0914   LDLCALC 108 (H) 07/21/2018 0914   LDLDIRECT 118.4 07/10/2012 0849    Physical Exam:    VS:  BP 136/72 (BP Location: Right Arm, Patient Position: Sitting, Cuff Size: Normal)   Pulse 81   Ht 5\' 2"  (1.575 m)   Wt 138 lb 1.9 oz (62.7 kg)   SpO2 99%   BMI 25.26 kg/m     Wt Readings from Last 3 Encounters:  08/22/18 138 lb 1.9 oz (62.7 kg)  07/23/18 139 lb 1.9 oz (63.1 kg)  07/21/18 142 lb 6.4 oz (64.6 kg)     GEN: Well nourished, well developed in no acute distress HEENT: Normal NECK: No JVD; No carotid bruits LYMPHATICS: No lymphadenopathy CARDIAC: regular rhythm, normal S1 and S2, no rubs, gallops. 1/6 systolic ejection murmur at sternal border. Radial and DP pulses 2+ bilaterally. RESPIRATORY:  Clear to auscultation without rales, wheezing or rhonchi  ABDOMEN: Soft, non-tender, non-distended MUSCULOSKELETAL:  No edema; No deformity  SKIN: Warm and dry NEUROLOGIC:  Alert and oriented x 3 PSYCHIATRIC:  Normal affect   ASSESSMENT:    1. Murmur, cardiac   2. Ventricular premature depolarization   3. Counseling on health promotion and disease prevention   4. Ventricular bigeminy seen on cardiac monitor   5. PVC (premature ventricular contraction)    PLAN:    1. Palpitations, bigeminy/trigeminy, high PVC burden: appears as bradycardia on exam (and likely on personal activity trackers).   -Monitor shoes PVC burden of 18%, with occasional bigeminy and trigeminy. One episode of SVT as well.   -Echo without clear structural abnormality.  -given PVC burden, will order nuclear treadmill test. Exercise will  show whether PVCs increase or decrease, exercise capacity, and BP pattern. As her baseline ECG will likely be uninterpretable, will pursue imaging to evaluate for ischemia as cause.  -she is working on weaning caffeine  2. Murmur: noted as trivial MR on echo. May be more mild-moderate based on murmur and dilation of left atrium, but continue to monitor. Mild TR as well.  3. Prevention: The 10-year ASCVD risk score Mikey Bussing DC Jr., et al., 2013) is: 9.9%   Values used to calculate the score:     Age: 10 years     Sex: Female     Is Non-Hispanic  African American: No     Diabetic: No     Tobacco smoker: No     Systolic Blood Pressure: 833 mmHg     Is BP treated: No     HDL Cholesterol: 77 mg/dL     Total Cholesterol: 195 mg/dL  Prevention: -recommend heart healthy/Mediterranean diet, with whole grains, fruits, vegetable, fish, lean meats, nuts, and olive oil. Limit salt. -recommend moderate walking, 3-5 times/week for 30-50 minutes each session. Aim for at least 150 minutes.week. Goal should be pace of 3 miles/hours, or walking 1.5 miles in 30 minutes -recommend avoidance of tobacco products. Avoid excess alcohol. -Additional risk factor control:  -Diabetes: A1c is 5.8  -Lipids: HDL 77, LDL 108  -Blood pressure control adequate  -Weight: BMI 25  Finger lesion: cut when cutting apple. Given appearance, may be cellulitis but concern with dark discoloration for pseudomonas/icthyma gangrenosum. No systemic symptoms. She has appointment today with medical provider to evaluate this, will defer treatment to them, but counseled on need for urgent evaluation.  Plan for follow up: she wishes to do the stress test after the holidays, so stress test in ~2 mos and follow up with me after.  Medication Adjustments/Labs and Tests Ordered: Current medicines are reviewed at length with the patient today.  Concerns regarding medicines are outlined above.  Orders Placed This Encounter  Procedures  .  MYOCARDIAL PERFUSION IMAGING   No orders of the defined types were placed in this encounter.   Patient Instructions  Medication Instructions:  Your Physician recommend you continue on your current medication as directed.    If you need a refill on your cardiac medications before your next appointment, please call your pharmacy.   Lab work: None   Testing/Procedures: Your physician has requested that you have en exercise stress myoview in 2 months. For further information please visit [email or web address]. Please follow instruction sheet, as given. [address]  Follow-Up: Your physician recommends that you schedule a follow-up appointment in 3 months with Dr. Harrell Gave   Any Other Special Instructions Will Be Listed Below (If Applicable).       Signed, Marilyn Dresser, MD PhD 08/22/2018 9:18 AM    [place]

## 2018-08-22 NOTE — Progress Notes (Signed)
Marilyn Hess is a 72 y.o. female  Chief Complaint  Patient presents with  . Hand Pain    middle finger on left hand swollen and bruised. pt nicked finger with knife 4 days ago. pt sts throbing pain. 2/10 no movement and 9/10 w/ movement    HPI: Marilyn Hess is a 72 y.o. female who complains of Lt middle finger swollen and bruised since she nicked that tip of it with a knife 4 days ago while cutting an apple. She washed the area with soap and water immediately after and then later with peroxide. She states the following day, she noted increasing area of bruising beneath the skin of the tip and pad of the finger. Increasing pain as well. She rates the pain as 1-2/10 at rest but with movement or palpation it is 8-9/10. No fever, chills. No numbness or tingling. ROM is normal but pain occurs with movement.  Tetanus is UTD - Tdap in 2016.   Past Medical History:  Diagnosis Date  . Breast lump   . Hyperlipidemia     Past Surgical History:  Procedure Laterality Date  . CARPAL TUNNEL RELEASE  11-2014  . TOOTH EXTRACTION  07-2011    Social History   Socioeconomic History  . Marital status: Divorced    Spouse name: Not on file  . Number of children: Not on file  . Years of education: Not on file  . Highest education level: Not on file  Occupational History  . Not on file  Social Needs  . Financial resource strain: Not on file  . Food insecurity:    Worry: Not on file    Inability: Not on file  . Transportation needs:    Medical: Not on file    Non-medical: Not on file  Tobacco Use  . Smoking status: Never Smoker  . Smokeless tobacco: Never Used  Substance and Sexual Activity  . Alcohol use: No    Alcohol/week: 0.0 standard drinks  . Drug use: No  . Sexual activity: Never  Lifestyle  . Physical activity:    Days per week: Not on file    Minutes per session: Not on file  . Stress: Not on file  Relationships  . Social connections:    Talks on phone: Not on  file    Gets together: Not on file    Attends religious service: Not on file    Active member of club or organization: Not on file    Attends meetings of clubs or organizations: Not on file    Relationship status: Not on file  . Intimate partner violence:    Fear of current or ex partner: Not on file    Emotionally abused: Not on file    Physically abused: Not on file    Forced sexual activity: Not on file  Other Topics Concern  . Not on file  Social History Narrative  . Not on file    Family History  Adopted: Yes  Problem Relation Age of Onset  . Stroke Father   . Alcohol abuse Father   . Hypertension Father   . Alzheimer's disease Mother   . Hypertension Brother   . Colon cancer Neg Hx      Immunization History  Administered Date(s) Administered  . Influenza, High Dose Seasonal PF 07/08/2017, 07/21/2018  . Influenza,inj,Quad PF,6+ Mos 08/05/2013, 08/08/2015  . Pneumococcal Conjugate-13 08/05/2014  . Pneumococcal Polysaccharide-23 08/08/2015  . Tdap 06/07/2015  . Zoster 02/21/2016    Outpatient  Encounter Medications as of 08/22/2018  Medication Sig Note  . alendronate (FOSAMAX) 70 MG tablet TAKE 1 TABLET BY MOUTH EVERY 7 DAYS. TAKE WITH A FULL GLASS OF WATER ON AN EMPTY STOMACH   . aspirin EC 81 MG tablet Take 81 mg by mouth daily.   . calcium-vitamin D (OSCAL WITH D) 500-200 MG-UNIT tablet Take 1 tablet by mouth. 07/08/2017: 1200 mg Calcium with 1000 mg D3  . metroNIDAZOLE (METROGEL) 1 % gel Apply 1 application topically daily. Reported on 10/05/2015 (Patient taking differently: Apply 1 application topically as needed. Reported on 10/05/2015)   . calcium carbonate (OS-CAL - DOSED IN MG OF ELEMENTAL CALCIUM) 1250 (500 CA) MG tablet Take 1 tablet by mouth. Reported on 10/05/2015   . cephALEXin (KEFLEX) 500 MG capsule Take 1 capsule (500 mg total) by mouth 2 (two) times daily for 10 days.    No facility-administered encounter medications on file as of 08/22/2018.       ROS: Gen: no fever, chills  Skin: no rash, itching ENT: no ear pain, ear drainage, nasal congestion, rhinorrhea, sinus pressure, sore throat Eyes: no blurry vision, double vision Resp: no cough, wheeze,SOB CV: no CP, palpitations, LE edema,  GI: no heartburn, n/v/d/c, abd pain GU: no dysuria, urgency, frequency, hematuria  MSK: + pain, swelling, ecchymosis of tip of Lt middle finger Neuro: no dizziness, headache, weakness, vertigo Psych: no depression, anxiety, insomnia   Allergies  Allergen Reactions  . Cinnamon     Headache, breathing problem to long exposure    BP (!) 142/76 (BP Location: Left Arm, Patient Position: Sitting, Cuff Size: Normal)   Pulse (!) 51   Temp (!) 97.4 F (36.3 C) (Oral)   Wt 139 lb 4.8 oz (63.2 kg)   SpO2 98%   BMI 25.48 kg/m  BP Readings from Last 3 Encounters:  08/22/18 (!) 142/76  08/22/18 136/72  07/23/18 128/62   Physical Exam  Constitutional: She is oriented to person, place, and time. She appears well-developed and well-nourished. No distress.  Musculoskeletal: She exhibits edema (Lt middle finger with swelling, ecchymosis of finger tip and pad of finger; hematoma present that is TTP) and tenderness.  Erythema of the Lt middle finger noted distal to the DIP joint with decreased ROM at DIP joint  Neurological: She is alert and oriented to person, place, and time.  Skin: Skin is warm and dry.  As above in MSK   Psychiatric: She has a normal mood and affect. Her behavior is normal.     A/P:  1. Puncture wound of left middle finger 2. Traumatic hematoma of finger, initial encounter - Tdap UTD (2016) - area cleaned with alcohol pad x 3, 22g needle used to puncture skin and drain hematoma, abx ointment and bandage applied, and pt tolerated without issue - warm water soaks BID Rx: Keflex 500mg  BID x 10 days - f/u if symptoms worsen or do not improve in 10-14 days Discussed plan and reviewed medications with patient, including  risks, benefits, and potential side effects. Pt expressed understand. All questions answered.

## 2018-08-26 ENCOUNTER — Encounter: Payer: Self-pay | Admitting: Cardiology

## 2018-10-23 ENCOUNTER — Telehealth (HOSPITAL_COMMUNITY): Payer: Self-pay

## 2018-10-23 NOTE — Telephone Encounter (Signed)
Encounter complete. 

## 2018-10-27 ENCOUNTER — Encounter (HOSPITAL_COMMUNITY): Payer: Medicare Other

## 2018-10-28 ENCOUNTER — Ambulatory Visit (HOSPITAL_COMMUNITY)
Admission: RE | Admit: 2018-10-28 | Discharge: 2018-10-28 | Disposition: A | Discharge status: Home / Self Care | Payer: Medicare Other | Source: Ambulatory Visit | Attending: Cardiovascular Disease | Admitting: Cardiovascular Disease

## 2018-10-28 DIAGNOSIS — I493 Ventricular premature depolarization: Secondary | ICD-10-CM | POA: Diagnosis not present

## 2018-10-28 LAB — MYOCARDIAL PERFUSION IMAGING
CHL RATE OF PERCEIVED EXERTION: 18
CSEPED: 6 min
CSEPEDS: 0 s
CSEPHR: 88 %
Estimated workload: 7 METS
MPHR: 150 {beats}/min
Peak HR: 133 {beats}/min
Rest HR: 63 {beats}/min
SDS: 2
SRS: 1
SSS: 3
TID: 1.06

## 2018-10-28 MED ORDER — TECHNETIUM TC 99M TETROFOSMIN IV KIT
9.8000 | PACK | Freq: Once | INTRAVENOUS | Status: AC | PRN
  Administered 2018-10-28: 9.8 via INTRAVENOUS
  Filled 2018-10-28: qty 10

## 2018-10-28 MED ORDER — TECHNETIUM TC 99M TETROFOSMIN IV KIT
31.8000 | PACK | Freq: Once | INTRAVENOUS | Status: AC | PRN
  Administered 2018-10-28: 31.8 via INTRAVENOUS
  Filled 2018-10-28: qty 32

## 2018-10-30 ENCOUNTER — Telehealth: Payer: Self-pay

## 2018-10-30 NOTE — Telephone Encounter (Signed)
Pt update with test results along with Dr. Judeth Cornfield recommendation. Pt verbalized understanding but inquiring as to what she needs to do going forward about her PVC's and SOB. She states, she notice the symptoms more when she's excreting herself and just doesn't have as much energy as she use to. Will route to MD

## 2018-10-30 NOTE — Telephone Encounter (Signed)
-----   Message from Buford Dresser, MD sent at 10/29/2018  9:36 PM EST ----- Normal exercise tolerance on the stress test. No ECG changes to suggest low blood flow. The pictures were normal, except for one area at the very tip of the heart. This is seen commonly in women due to breast tissue and is considered to be an artifact of the test and not due to blockage. Low risk study.

## 2018-11-03 NOTE — Telephone Encounter (Signed)
Spoke with pt who states she has decreased her caffeine intake and has seen a big difference. Pt states she would like to continue to try lifestyle changes before starting medication.

## 2018-11-03 NOTE — Telephone Encounter (Signed)
Left message to call back  

## 2018-11-03 NOTE — Telephone Encounter (Signed)
There are several medications that we can try to control the PVCs. All of the medications have pros and cons. The one we use most commonly is metoprolol. I would start a low dose, like metoprolol tartrate 12.5 mg daily, and see if it helps. The biggest down side of metoprolol is that it can make people tired, which is one of the symptoms she is dealing with. But, if the fatigue is coming from the PVCs, this may actually make the fatigue less. I would try it and see how she does, and then let's see her back in about 2 mos to talk about next steps. Thanks

## 2018-12-15 ENCOUNTER — Ambulatory Visit: Payer: Medicare Other | Admitting: Cardiology

## 2018-12-15 ENCOUNTER — Encounter: Payer: Self-pay | Admitting: Cardiology

## 2018-12-15 ENCOUNTER — Encounter (INDEPENDENT_AMBULATORY_CARE_PROVIDER_SITE_OTHER): Payer: Self-pay

## 2018-12-15 VITALS — BP 132/60 | HR 72 | Ht 60.0 in | Wt 145.0 lb

## 2018-12-15 DIAGNOSIS — Z7189 Other specified counseling: Secondary | ICD-10-CM

## 2018-12-15 DIAGNOSIS — I499 Cardiac arrhythmia, unspecified: Secondary | ICD-10-CM | POA: Diagnosis not present

## 2018-12-15 DIAGNOSIS — I498 Other specified cardiac arrhythmias: Secondary | ICD-10-CM

## 2018-12-15 DIAGNOSIS — I493 Ventricular premature depolarization: Secondary | ICD-10-CM | POA: Diagnosis not present

## 2018-12-15 DIAGNOSIS — R011 Cardiac murmur, unspecified: Secondary | ICD-10-CM

## 2018-12-15 NOTE — Progress Notes (Signed)
Cardiology Office Note:    Date:  12/17/2018   ID:  Marilyn Hess, DOB September 16, 1946, MRN [id number]  PCP:  Darreld Mclean, MD  Cardiologist:  Buford Dresser, MD PhD  Referring MD: Darreld Mclean, MD   CC: follow up  History of Present Illness:    Marilyn Hess is a 73 y.o. female with a history of reported valve abnormality who is seen in follow up for the evaluation and management of bradycardia and palpitations.   Cardiac history: Seen by Dr. Lorelei Pont on 07/21/18. She noted an incidental heart rate in the 50s recently at home, and her heart rate in the office was noted to be 41. ECG noted bigeminy at 71 bpm. Her initial consult with me was on 07/23/18, and echo/monitor ordered at that visit. She noted infrequent, brief palpitations at that time.  The episodes are infrequent, brief, and self limited. Had one episode of near syncope when she "sat on the toilet for too long", felt very lightheaded when she got up. No full syncope. Cutting back on caffeine, had been drinking >1 quart of coffee in the AM.  Reports history of abnormal valve, echo "many years ago" as a child. Does not think it was her mitral valve. Prior ECG: ventricular bigeminy  Today:  Has gone from 6 cups of full strength coffee to 4 cups of low caffeine coffee. Has noticed now that if she goes back to full strength she feels symptoms. Has felt somewhat better cutting back. Overall feels that she is doing well. Wants to avoid medication if she can, wishes to continue modifying with lifestyle.   Past Medical History:  Diagnosis Date  . Breast lump   . Hyperlipidemia     Past Surgical History:  Procedure Laterality Date  . CARPAL TUNNEL RELEASE  11-2014  . TOOTH EXTRACTION  07-2011    Current Medications: Current Outpatient Medications on File Prior to Visit  Medication Sig  . alendronate (FOSAMAX) 70 MG tablet TAKE 1 TABLET BY MOUTH EVERY 7 DAYS. TAKE WITH A FULL GLASS OF WATER ON AN EMPTY STOMACH    . aspirin EC 81 MG tablet Take 81 mg by mouth daily.  . calcium carbonate (OS-CAL - DOSED IN MG OF ELEMENTAL CALCIUM) 1250 (500 CA) MG tablet Take 1 tablet by mouth. Reported on 10/05/2015  . calcium-vitamin D (OSCAL WITH D) 500-200 MG-UNIT tablet Take 1 tablet by mouth.  . metroNIDAZOLE (METROGEL) 1 % gel Apply 1 application topically daily. Reported on 10/05/2015 (Patient taking differently: Apply 1 application topically as needed. Reported on 10/05/2015)   No current facility-administered medications on file prior to visit.      Allergies:   Cinnamon   Social History   Socioeconomic History  . Marital status: Divorced    Spouse name: Not on file  . Number of children: Not on file  . Years of education: Not on file  . Highest education level: Not on file  Occupational History  . Not on file  Social Needs  . Financial resource strain: Not on file  . Food insecurity:    Worry: Not on file    Inability: Not on file  . Transportation needs:    Medical: Not on file    Non-medical: Not on file  Tobacco Use  . Smoking status: Never Smoker  . Smokeless tobacco: Never Used  Substance and Sexual Activity  . Alcohol use: No    Alcohol/week: 0.0 standard drinks  . Drug use: No  . Sexual  activity: Never  Lifestyle  . Physical activity:    Days per week: Not on file    Minutes per session: Not on file  . Stress: Not on file  Relationships  . Social connections:    Talks on phone: Not on file    Gets together: Not on file    Attends religious service: Not on file    Active member of club or organization: Not on file    Attends meetings of clubs or organizations: Not on file    Relationship status: Not on file  Other Topics Concern  . Not on file  Social History Narrative  . Not on file     Family History: [name] history includes Alcohol abuse in her father; Alzheimer's disease in her mother; Hypertension in her brother and father; Stroke in her father. There  is no history of Colon cancer. She was adopted. she is adopted but knows that her biological father and brother had strokes. They had poor lifestyle with smoking and alcohol.  ROS:   Please see the history of present illness.  Additional pertinent ROS:  Constitutional: Negative for chills, fever, night sweats, unintentional weight loss. Has had more fatigue. HENT: Negative for ear pain and hearing loss.   Eyes: Negative for loss of vision and eye pain.  Respiratory: Negative for cough, sputum, wheezing.  Does feel more short of breath with exertion. Cardiovascular: Positive for palpitations. Negative for chest pain, PND, orthopnea, Has intermittent mild lower extremity edema.  Gastrointestinal: Negative for abdominal pain, melena, and hematochezia.  Genitourinary: Negative for dysuria and hematuria.  Musculoskeletal: Negative for falls and myalgias.  Skin: Negative for itching and rash. Left third fingertip with dark discoloration surrounded by edema and erythema. Pulse intact. Neurological: Negative for focal weakness, focal sensory changes and loss of consciousness. Has a history of headaches, dizziness. Endo/Heme/Allergies: Does bruise/bleed easily.    EKGs/Labs/Other Studies Reviewed:    The following studies were reviewed today:  Treadmill nuclear 10/28/2018  Patient walked for 6:00 of a Bruce protocol GXt. Peak HR was 133 which is 88% .  At peak exercise, there were no ST or T wave changes to suggest ischemia.  Defect 1: There is a small defect of mild severity present in the apex location. This is likely due to artifact.  There is no evidence of ischemia. There is apical thinning but this is likely artifact. LV function information is not abailable due to inability to gate the study .  This is a low risk study. LV EF information is not available. Suggest echocardiogram if not already performed  Echo 07/28/18 Study Conclusions  - Left ventricle: The cavity size was normal.  Systolic function was   normal. The estimated ejection fraction was in the range of 55%   to 60%. Wall motion was normal; there were no regional wall   motion abnormalities. Left ventricular diastolic function   parameters were normal. - Aortic valve: Valve area (VTI): 1.63 cm^2. Valve area (Vmax):   1.48 cm^2. Valve area (Vmean): 1.61 cm^2. - Left atrium: The atrium was moderately to severely dilated. - Right atrium: The atrium was moderately dilated. - Tricuspid valve: Peak RV-RA gradient (S): 18 mm Hg. - Pulmonary arteries: PA peak pressure: 33 mm Hg (S).  Impressions:  - Normal left and right ventricular chamber size and function, with   LVEF 55-60%. No hemodynamically significant valve disease.   Biatrial enlargement. RVSP approximately 27-32 mmHg.  Monitor 08/07/18 4 days, 1 hour of  interpretable data on [facility]. No patient triggered events, and one auto recorded event of SVT. No sustained VT, pauses, or high degree AV block noted.  Patient had a min HR of 45 bpm, max HR of 141 bpm, and avg HR of 74 bpm. Predominant underlying rhythm was Sinus Rhythm. Intermittent Bundle Branch Block was present.   1 run of Supraventricular Tachycardia occurred lasting 6 beats with a max rate of 141 bpm (avg 134 bpm). Isolated SVEs were rare (<1.0%), SVE Couplets were rare (<1.0%), and SVE Triplets were rare (<1.0%). Isolated VEs were frequent (18.0%, [id number]), VE Couplets were rare (<1.0%, 452), and VE Triplets were rare (<1.0%, 7). Ventricular Bigeminy and Trigeminy were present.  EKG:  The ekg ordered previously demonstrates sinus rhythm with ventricular trigeminy  Recent Labs: 07/21/2018: ALT 13; BUN 22; Creatinine, Ser 0.82; Hemoglobin 12.3; Platelets 207.0; Potassium 3.7; Sodium 141; TSH 1.61  Recent Lipid Panel    Component Value Date/Time   CHOL 195 07/21/2018 0914   TRIG 50.0 07/21/2018 0914   TRIG 54 08/26/2006 0925   HDL 77.00 07/21/2018 0914   CHOLHDL 3 07/21/2018 0914   VLDL  10.0 07/21/2018 0914   LDLCALC 108 (H) 07/21/2018 0914   LDLDIRECT 118.4 07/10/2012 0849    Physical Exam:    VS:  BP 132/60 (BP Location: Left Arm, Patient Position: Sitting, Cuff Size: Normal)   Pulse 72   Ht 5' (1.524 m)   Wt 145 lb (65.8 kg)   BMI 28.32 kg/m     Wt Readings from Last 3 Encounters:  12/15/18 145 lb (65.8 kg)  10/28/18 139 lb (63 kg)  08/22/18 139 lb 4.8 oz (63.2 kg)     GEN: Well nourished, well developed in no acute distress HEENT: Normal NECK: No JVD; No carotid bruits LYMPHATICS: No lymphadenopathy CARDIAC: regular rhythm in trigeminy by auscultation, normal S1 and S2, no rubs, gallops. 1/6 systolic ejection murmur at sternal border. Radial and DP pulses 2+ bilaterally. RESPIRATORY:  Clear to auscultation without rales, wheezing or rhonchi  ABDOMEN: Soft, non-tender, non-distended MUSCULOSKELETAL:  No edema; No deformity  SKIN: Warm and dry NEUROLOGIC:  Alert and oriented x 3 PSYCHIATRIC:  Normal affect   ASSESSMENT:    1. PVC (premature ventricular contraction)   2. Ventricular bigeminy   3. Murmur, cardiac   4. Counseling on health promotion and disease prevention    PLAN:    1. Palpitations, bigeminy/trigeminy, high PVC burden: appears as bradycardia on exam (and likely on personal activity trackers).   -Monitor shoes PVC burden of 18%, with occasional bigeminy and trigeminy. One episode of SVT as well.   -Echo without clear structural abnormality.  -nuclear treadmill without evidence of ischemia  -she is working on weaning caffeine  -discussed both metoprolol and diltiazem. Prefers to avoid metoprolol because of fatigue. Wants to work on continued cutting back on caffeine and increasing activity before trialing medication.  2. Murmur: noted as trivial MR on echo. May be more mild-moderate based on murmur and dilation of left atrium, but continue to monitor. Mild TR as well.  3. Prevention: The 10-year ASCVD risk score Mikey Bussing DC Jr., et al.,  2013) is: 9.4%   Values used to calculate the score:     Age: 72 years     Sex: Female     Is Non-Hispanic African American: No     Diabetic: No     Tobacco smoker: No     Systolic Blood Pressure: 314 mmHg  Is BP treated: No     HDL Cholesterol: 77 mg/dL     Total Cholesterol: 195 mg/dL  Prevention: -recommend heart healthy/Mediterranean diet, with whole grains, fruits, vegetable, fish, lean meats, nuts, and olive oil. Limit salt. -recommend moderate walking, 3-5 times/week for 30-50 minutes each session. Aim for at least 150 minutes.week. Goal should be pace of 3 miles/hours, or walking 1.5 miles in 30 minutes -recommend avoidance of tobacco products. Avoid excess alcohol. -Additional risk factor control:  -Diabetes: A1c is 5.8  -Lipids: HDL 77, LDL 108  -Blood pressure control adequate  -Weight: BMI 25  Plan for follow up: 6 mos or sooner PRN TIME SPENT WITH PATIENT: 20 minutes of direct patient care. More than 50% of that time was spent on coordination of care and counseling regarding PVCs and management.  Buford Dresser, MD, PhD [place]  [facility]   Medication Adjustments/Labs and Tests Ordered: Current medicines are reviewed at length with the patient today.  Concerns regarding medicines are outlined above.  No orders of the defined types were placed in this encounter.  No orders of the defined types were placed in this encounter.   Patient Instructions  Medication Instructions:  Your Physician recommend you continue on your current medication as directed.    If you need a refill on your cardiac medications before your next appointment, please call your pharmacy.   Lab work: None  Testing/Procedures: None  Follow-Up: At [facility], you and your health needs are our priority.  As part of our continuing mission to provide you with exceptional heart care, we have created designated Provider Care Teams.  These Care Teams include your primary  Cardiologist (physician) and Advanced Practice Providers (APPs -  Physician Assistants and Nurse Practitioners) who all work together to provide you with the care you need, when you need it. You will need a follow up appointment in 6 months.  Please call our office 2 months in advance to schedule this appointment.  You may see Buford Dresser, MD or one of the following Advanced Practice Providers on your designated Care Team:   Rosaria Ferries, PA-C . Jory Sims, DNP, ANP        Signed, Buford Dresser, MD PhD 12/17/2018 9:52 AM    [place]

## 2018-12-15 NOTE — Patient Instructions (Signed)

## 2018-12-17 ENCOUNTER — Encounter: Payer: Self-pay | Admitting: Cardiology

## 2019-01-05 ENCOUNTER — Other Ambulatory Visit: Payer: Self-pay | Admitting: Family Medicine

## 2019-01-05 DIAGNOSIS — M85859 Other specified disorders of bone density and structure, unspecified thigh: Secondary | ICD-10-CM

## 2019-04-10 NOTE — Progress Notes (Addendum)
Virtual Visit via Video Note  I connected with patient on 04/13/19 at 10:00 AM EDT by audio enabled telemedicine application and verified that I am speaking with the correct person using two identifiers.   THIS ENCOUNTER IS A VIRTUAL VISIT DUE TO COVID-19 - PATIENT WAS NOT SEEN IN THE OFFICE. PATIENT HAS CONSENTED TO VIRTUAL VISIT / TELEMEDICINE VISIT   Location of patient: home  Location of provider: office  I discussed the limitations of evaluation and management by telemedicine and the availability of in person appointments. The patient expressed understanding and agreed to proceed.   Subjective:   Marilyn Hess is a 72 y.o. female who presents for Medicare Annual (Subsequent) preventive examination.  Review of Systems: No ROS.  Medicare Wellness Virtual Visit.  Visual/audio telehealth visit, UTA vital signs.   See social history for additional risk factors. Cardiac Risk Factors include: advanced age (>78men, >44 women);dyslipidemia Sleep patterns: varies. Home Safety/Smoke Alarms: Feels safe in home. Smoke alarms in place.  Lives alone. No stairs.  Female:       Mammo- pt states she will schedule.      Dexa scan-   07/11/17     CCS- last 10/05/15. 5 yr recall     Objective:     Advanced Directives 04/13/2019 04/11/2018 04/08/2017 09/21/2015  Does Patient Have a Medical Advance Directive? No No No No  Would patient like information on creating a medical advance directive? No - Patient declined Yes (MAU/Ambulatory/Procedural Areas - Information given) Yes (MAU/Ambulatory/Procedural Areas - Information given) No - patient declined information    Tobacco Social History   Tobacco Use  Smoking Status Never Smoker  Smokeless Tobacco Never Used     Counseling given: Not Answered   Clinical Intake: Pain : No/denies pain   Past Medical History:  Diagnosis Date  . Breast lump   . Hyperlipidemia    Past Surgical History:  Procedure Laterality Date  . CARPAL TUNNEL  RELEASE  11-2014  . TOOTH EXTRACTION  07-2011   Family History  Adopted: Yes  Problem Relation Age of Onset  . Stroke Father   . Alcohol abuse Father   . Hypertension Father   . Alzheimer's disease Mother   . Hypertension Brother   . Colon cancer Neg Hx    Social History   Socioeconomic History  . Marital status: Divorced    Spouse name: Not on file  . Number of children: Not on file  . Years of education: Not on file  . Highest education level: Not on file  Occupational History  . Not on file  Social Needs  . Financial resource strain: Not on file  . Food insecurity    Worry: Not on file    Inability: Not on file  . Transportation needs    Medical: Not on file    Non-medical: Not on file  Tobacco Use  . Smoking status: Never Smoker  . Smokeless tobacco: Never Used  Substance and Sexual Activity  . Alcohol use: No    Alcohol/week: 0.0 standard drinks  . Drug use: No  . Sexual activity: Never  Lifestyle  . Physical activity    Days per week: Not on file    Minutes per session: Not on file  . Stress: Not on file  Relationships  . Social Herbalist on phone: Not on file    Gets together: Not on file    Attends religious service: Not on file    Active member of  club or organization: Not on file    Attends meetings of clubs or organizations: Not on file    Relationship status: Not on file  Other Topics Concern  . Not on file  Social History Narrative  . Not on file    Outpatient Encounter Medications as of 04/13/2019  Medication Sig  . alendronate (FOSAMAX) 70 MG tablet TAKE 1 TABLET BY MOUTH EVERY 7 DAYS. TAKE WITH A FULL GLASS OF WATER ON AN EMPTY STOMACH  . aspirin EC 81 MG tablet Take 81 mg by mouth daily.  . calcium carbonate (OS-CAL - DOSED IN MG OF ELEMENTAL CALCIUM) 1250 (500 CA) MG tablet Take 1 tablet by mouth. Reported on 10/05/2015  . calcium-vitamin D (OSCAL WITH D) 500-200 MG-UNIT tablet Take 1 tablet by mouth.  . metroNIDAZOLE  (METROGEL) 1 % gel Apply 1 application topically daily. Reported on 10/05/2015 (Patient taking differently: Apply 1 application topically as needed. Reported on 10/05/2015)   No facility-administered encounter medications on file as of 04/13/2019.     Activities of Daily Living In your present state of health, do you have any difficulty performing the following activities: 04/13/2019  Hearing? N  Vision? N  Difficulty concentrating or making decisions? N  Walking or climbing stairs? N  Dressing or bathing? N  Doing errands, shopping? N  Preparing Food and eating ? N  Using the Toilet? N  In the past six months, have you accidently leaked urine? N  Do you have problems with loss of bowel control? N  Managing your Medications? N  Managing your Finances? N  Housekeeping or managing your Housekeeping? N  Some recent data might be hidden    Patient Care Team: Copland, Gay Filler, MD as PCP - General (Family Medicine) Buford Dresser, MD as PCP - Cardiology (Cardiology) [place], Jennefer Bravo, MD as Referring Physician (Dermatology)    Assessment:   This is a routine wellness examination for Marilyn Hess. Physical assessment deferred to PCP.  Exercise Activities and Dietary recommendations Current Exercise Habits: Home exercise routine, Type of exercise: walking, Time (Minutes): 40, Frequency (Times/Week): 4, Weekly Exercise (Minutes/Week): 160, Intensity: Mild, Exercise limited by: None identified Diet (meal preparation, eat out, water intake, caffeinated beverages, dairy products, fruits and vegetables): in general, a "healthy" diet  , well balanced   Goals    . Begin oraganizing home (pt-stated)    . Continue exercising (pt-stated)    . DIET - INCREASE WATER INTAKE       Fall Risk Fall Risk  04/13/2019 04/11/2018 04/08/2017 08/08/2015 08/05/2014  Falls in the past year? 0 No No No No   Depression Screen PHQ 2/9 Scores 04/13/2019 04/11/2018 04/08/2017 08/08/2015  PHQ - 2  Score 0 0 0 0     Cognitive Function Ad8 score reviewed for issues:  Issues making decisions:no  Less interest in hobbies / activities:no  Repeats questions, stories (family complaining):no  Trouble using ordinary gadgets (microwave, computer, phone):no  Forgets the month or year: no  Mismanaging finances: no  Remembering appts:no  Daily problems with thinking and/or memory:no Ad8 score is=0         Immunization History  Administered Date(s) Administered  . Influenza, High Dose Seasonal PF 07/08/2017, 07/21/2018  . Influenza,inj,Quad PF,6+ Mos 08/05/2013, 08/08/2015  . Pneumococcal Conjugate-13 08/05/2014  . Pneumococcal Polysaccharide-23 08/08/2015  . Tdap 06/07/2015  . Zoster 02/21/2016    Screening Tests Health Maintenance  Topic Date Due  . MAMMOGRAM  02/04/2019  . INFLUENZA VACCINE  05/23/2019  .  COLONOSCOPY  10/04/2020  . TETANUS/TDAP  06/06/2025  . DEXA SCAN  Completed  . Hepatitis C Screening  Completed  . PNA vac Low Risk Adult  Completed      Plan:   See you next year!  Continue to eat heart healthy diet (full of fruits, vegetables, whole grains, lean protein, water--limit salt, fat, and sugar intake) and increase physical activity as tolerated.  Continue doing brain stimulating activities (puzzles, reading, adult coloring books, staying active) to keep memory sharp.    I have personally reviewed and noted the following in the patient's chart:   . Medical and social history . Use of alcohol, tobacco or illicit drugs  . Current medications and supplements . Functional ability and status . Nutritional status . Physical activity . Advanced directives . List of other physicians . Hospitalizations, surgeries, and ER visits in previous 12 months . Vitals . Screenings to include cognitive, depression, and falls . Referrals and appointments  In addition, I have reviewed and discussed with patient certain preventive protocols, quality metrics,  and best practice recommendations. A written personalized care plan for preventive services as well as general preventive health recommendations were provided to patient.     Shela Nevin, [place]  04/13/2019   I have reviewed the above MWE note by Ms Vevelyn Royals and agree with her documentation  Denny Peon MD

## 2019-04-13 ENCOUNTER — Other Ambulatory Visit: Payer: Self-pay

## 2019-04-13 ENCOUNTER — Ambulatory Visit (INDEPENDENT_AMBULATORY_CARE_PROVIDER_SITE_OTHER): Payer: Medicare Other | Admitting: *Deleted

## 2019-04-13 ENCOUNTER — Encounter: Payer: Self-pay | Admitting: *Deleted

## 2019-04-13 DIAGNOSIS — Z0000 Encounter for general adult medical examination without abnormal findings: Secondary | ICD-10-CM | POA: Diagnosis not present

## 2019-04-13 NOTE — Patient Instructions (Signed)
See you next year!  Continue to eat heart healthy diet (full of fruits, vegetables, whole grains, lean protein, water--limit salt, fat, and sugar intake) and increase physical activity as tolerated.  Continue doing brain stimulating activities (puzzles, reading, adult coloring books, staying active) to keep memory sharp.    Marilyn Hess , Thank you for taking time to come for your Medicare Wellness Visit. I appreciate your ongoing commitment to your health goals. Please review the following plan we discussed and let me know if I can assist you in the future.   These are the goals we discussed: Goals    . Begin oraganizing home (pt-stated)    . Continue exercising (pt-stated)    . DIET - INCREASE WATER INTAKE       This is a list of the screening recommended for you and due dates:  Health Maintenance  Topic Date Due  . Mammogram  02/04/2019  . Flu Shot  05/23/2019  . Colon Cancer Screening  10/04/2020  . Tetanus Vaccine  06/06/2025  . DEXA scan (bone density measurement)  Completed  .  Hepatitis C: One time screening is recommended by Center for Disease Control  (CDC) for  adults born from [birth year] through 1963.   Completed  . Pneumonia vaccines  Completed    Health Maintenance After Age 4 After age 63, you are at a higher risk for certain long-term diseases and infections as well as injuries from falls. Falls are a major cause of broken bones and head injuries in people who are older than age 32. Getting regular preventive care can help to keep you healthy and well. Preventive care includes getting regular testing and making lifestyle changes as recommended by your health care provider. Talk with your health care provider about:  Which screenings and tests you should have. A screening is a test that checks for a disease when you have no symptoms.  A diet and exercise plan that is right for you. What should I know about screenings and tests to prevent falls? Screening and testing are  the best ways to find a health problem early. Early diagnosis and treatment give you the best chance of managing medical conditions that are common after age 25. Certain conditions and lifestyle choices may make you more likely to have a fall. Your health care provider may recommend:  Regular vision checks. Poor vision and conditions such as cataracts can make you more likely to have a fall. If you wear glasses, make sure to get your prescription updated if your vision changes.  Medicine review. Work with your health care provider to regularly review all of the medicines you are taking, including over-the-counter medicines. Ask your health care provider about any side effects that may make you more likely to have a fall. Tell your health care provider if any medicines that you take make you feel dizzy or sleepy.  Osteoporosis screening. Osteoporosis is a condition that causes the bones to get weaker. This can make the bones weak and cause them to break more easily.  Blood pressure screening. Blood pressure changes and medicines to control blood pressure can make you feel dizzy.  Strength and balance checks. Your health care provider may recommend certain tests to check your strength and balance while standing, walking, or changing positions.  Foot health exam. Foot pain and numbness, as well as not wearing proper footwear, can make you more likely to have a fall.  Depression screening. You may be more likely to have a  fall if you have a fear of falling, feel emotionally low, or feel unable to do activities that you used to do.  Alcohol use screening. Using too much alcohol can affect your balance and may make you more likely to have a fall. What actions can I take to lower my risk of falls? General instructions  Talk with your health care provider about your risks for falling. Tell your health care provider if: ? You fall. Be sure to tell your health care provider about all falls, even ones that  seem minor. ? You feel dizzy, sleepy, or off-balance.  Take over-the-counter and prescription medicines only as told by your health care provider. These include any supplements.  Eat a healthy diet and maintain a healthy weight. A healthy diet includes low-fat dairy products, low-fat (lean) meats, and fiber from whole grains, beans, and lots of fruits and vegetables. Home safety  Remove any tripping hazards, such as rugs, cords, and clutter.  Install safety equipment such as grab bars in bathrooms and safety rails on stairs.  Keep rooms and walkways well-lit. Activity   Follow a regular exercise program to stay fit. This will help you maintain your balance. Ask your health care provider what types of exercise are appropriate for you.  If you need a cane or walker, use it as recommended by your health care provider.  Wear supportive shoes that have nonskid soles. Lifestyle  Do not drink alcohol if your health care provider tells you not to drink.  If you drink alcohol, limit how much you have: ? 0-1 drink a day for women. ? 0-2 drinks a day for men.  Be aware of how much alcohol is in your drink. In the [place] one drink equals one typical bottle of beer (12 oz), one-half glass of wine (5 oz), or one shot of hard liquor (1 oz).  Do not use any products that contain nicotine or tobacco, such as cigarettes and e-cigarettes. If you need help quitting, ask your health care provider. Summary  Having a healthy lifestyle and getting preventive care can help to protect your health and wellness after age 42.  Screening and testing are the best way to find a health problem early and help you avoid having a fall. Early diagnosis and treatment give you the best chance for managing medical conditions that are more common for people who are older than age 93.  Falls are a major cause of broken bones and head injuries in people who are older than age 31. Take precautions to prevent a fall at  home.  Work with your health care provider to learn what changes you can make to improve your health and wellness and to prevent falls. This information is not intended to replace advice given to you by your health care provider. Make sure you discuss any questions you have with your health care provider. Document Released: 08/21/2017 Document Revised: 08/21/2017 Document Reviewed: 08/21/2017 Elsevier Interactive Patient Education  2019 [employer].

## 2019-04-20 ENCOUNTER — Telehealth: Payer: Self-pay

## 2019-04-20 DIAGNOSIS — Z1239 Encounter for other screening for malignant neoplasm of breast: Secondary | ICD-10-CM

## 2019-04-20 NOTE — Addendum Note (Signed)
Addended by: Lamar Blinks C on: 04/20/2019 03:44 PM   Modules accepted: Orders

## 2019-04-20 NOTE — Telephone Encounter (Signed)
She needs mammo order sent to [facility]

## 2019-04-20 NOTE — Telephone Encounter (Signed)
Copied from [place] [id number]. Topic: Appointment Scheduling - Scheduling Inquiry for Clinic >> Apr 13, 2019 11:29 AM Jeri Cos wrote: Reason for CRM: Pt called stating that Dr. Lorelei Pont told her to call her when she has the information needed to make an appt for her mammogram. Please call pt to schedule appt.

## 2019-04-20 NOTE — Telephone Encounter (Signed)
Does patient need to be scheduled to see you?

## 2019-06-11 ENCOUNTER — Other Ambulatory Visit: Payer: Self-pay

## 2019-06-11 ENCOUNTER — Ambulatory Visit
Admission: RE | Admit: 2019-06-11 | Discharge: 2019-06-11 | Disposition: A | Discharge status: Home / Self Care | Payer: Medicare Other | Source: Ambulatory Visit | Attending: Family Medicine | Admitting: Family Medicine

## 2019-06-11 DIAGNOSIS — Z1239 Encounter for other screening for malignant neoplasm of breast: Secondary | ICD-10-CM

## 2019-06-22 ENCOUNTER — Other Ambulatory Visit: Payer: Self-pay

## 2019-06-22 ENCOUNTER — Encounter: Payer: Self-pay | Admitting: Cardiology

## 2019-06-22 ENCOUNTER — Ambulatory Visit (INDEPENDENT_AMBULATORY_CARE_PROVIDER_SITE_OTHER): Payer: Medicare Other | Admitting: Cardiology

## 2019-06-22 VITALS — BP 152/72 | Temp 97.3°F | Ht 62.0 in | Wt 141.2 lb

## 2019-06-22 DIAGNOSIS — I10 Essential (primary) hypertension: Secondary | ICD-10-CM | POA: Diagnosis not present

## 2019-06-22 DIAGNOSIS — I499 Cardiac arrhythmia, unspecified: Secondary | ICD-10-CM | POA: Diagnosis not present

## 2019-06-22 DIAGNOSIS — I498 Other specified cardiac arrhythmias: Secondary | ICD-10-CM

## 2019-06-22 DIAGNOSIS — I493 Ventricular premature depolarization: Secondary | ICD-10-CM | POA: Diagnosis not present

## 2019-06-22 DIAGNOSIS — R011 Cardiac murmur, unspecified: Secondary | ICD-10-CM

## 2019-06-22 DIAGNOSIS — Z7189 Other specified counseling: Secondary | ICD-10-CM

## 2019-06-22 NOTE — Patient Instructions (Signed)

## 2019-06-22 NOTE — Progress Notes (Signed)
Cardiology Office Note:    Date:  06/22/2019   ID:  Marilyn Hess, DOB 1946-05-02, MRN [id number]  PCP:  Darreld Mclean, MD  Cardiologist:  Buford Dresser, MD PhD  Referring MD: Darreld Mclean, MD   CC: follow up  History of Present Illness:    Marilyn Hess is a 73 y.o. female with a history of reported valve abnormality who is seen in follow up for the evaluation and management of bradycardia and palpitations.   Cardiac history: Seen by Dr. Lorelei Pont on 07/21/18. She noted an incidental heart rate in the 50s recently at home, and her heart rate in the office was noted to be 41. ECG noted bigeminy at 71 bpm. Her initial consult with me was on 07/23/18, and echo/monitor ordered at that visit. She noted infrequent, brief palpitations at that time.  The episodes are infrequent, brief, and self limited. Had one episode of near syncope when she "sat on the toilet for too long", felt very lightheaded when she got up. No full syncope. Cutting back on caffeine, had been drinking >1 quart of coffee in the AM.  Reports history of abnormal valve, echo "many years ago" as a child. Does not think it was her mitral valve. Prior ECG: ventricular bigeminy  Today: Staying safe from coronavirus. Walking is her main exercise; was a lot of work to get started but has worked her way up. Now walks for 1 hour (10 laps), which is 2.5 miles. Walking 4x/week, though last few days has had foot pain limiting her. Down to 4 cups of coffee, which is 3/4 decaf. Rest of drinks are decaf. Has not experienced "heart tremors" recently.  Concerns today: more ankle swelling, mild gradual skin discoloration in her legs over the last year.   Denies chest pain, shortness of breath at rest or with normal exertion. Does notice more SOB when doing heavy exertion that relies on arm strength (dragging tree limbs, etc). No PND, orthopnea, significant LE edema or unexpected weight gain. No syncope, rare palpitations.   Past Medical History:  Diagnosis Date  . Breast lump   . Hyperlipidemia     Past Surgical History:  Procedure Laterality Date  . CARPAL TUNNEL RELEASE  11-2014  . TOOTH EXTRACTION  07-2011    Current Medications: Current Outpatient Medications on File Prior to Visit  Medication Sig  . alendronate (FOSAMAX) 70 MG tablet TAKE 1 TABLET BY MOUTH EVERY 7 DAYS. TAKE WITH A FULL GLASS OF WATER ON AN EMPTY STOMACH  . aspirin EC 81 MG tablet Take 81 mg by mouth daily.  . calcium carbonate (OS-CAL - DOSED IN MG OF ELEMENTAL CALCIUM) 1250 (500 CA) MG tablet Take 1 tablet by mouth. Reported on 10/05/2015  . calcium-vitamin D (OSCAL WITH D) 500-200 MG-UNIT tablet Take 1 tablet by mouth.  . metroNIDAZOLE (METROGEL) 1 % gel Apply 1 application topically daily. Reported on 10/05/2015 (Patient taking differently: Apply 1 application topically as needed. Reported on 10/05/2015)   No current facility-administered medications on file prior to visit.      Allergies:   Cinnamon   Social History   Socioeconomic History  . Marital status: Divorced    Spouse name: Not on file  . Number of children: Not on file  . Years of education: Not on file  . Highest education level: Not on file  Occupational History  . Not on file  Social Needs  . Financial resource strain: Not on file  . Food insecurity  Worry: Not on file    Inability: Not on file  . Transportation needs    Medical: Not on file    Non-medical: Not on file  Tobacco Use  . Smoking status: Never Smoker  . Smokeless tobacco: Never Used  Substance and Sexual Activity  . Alcohol use: No    Alcohol/week: 0.0 standard drinks  . Drug use: No  . Sexual activity: Never  Lifestyle  . Physical activity    Days per week: Not on file    Minutes per session: Not on file  . Stress: Not on file  Relationships  . Social Herbalist on phone: Not on file    Gets together: Not on file    Attends religious service: Not on file     Active member of club or organization: Not on file    Attends meetings of clubs or organizations: Not on file    Relationship status: Not on file  Other Topics Concern  . Not on file  Social History Narrative  . Not on file     Family History: [name] history includes Alcohol abuse in her father; Alzheimer's disease in her mother; Hypertension in her brother and father; Stroke in her father. There is no history of Colon cancer. She was adopted. she is adopted but knows that her biological father and brother had strokes. They had poor lifestyle with smoking and alcohol.  ROS:   Please see the history of present illness.  Additional pertinent ROS: Constitutional: Negative for chills, fever, night sweats, unintentional weight loss. Positive for fatigue HENT: Negative for ear pain and hearing loss.   Eyes: Negative for loss of vision and eye pain.  Respiratory: Negative for cough, sputum, wheezing.   Cardiovascular: See HPI. Gastrointestinal: Negative for abdominal pain, melena, and hematochezia.  Genitourinary: Negative for dysuria and hematuria.  Musculoskeletal: Negative for falls and myalgias.  Skin: Negative for itching and rash. Positive for skin changes in lower legs bilaterally. Neurological: Negative for focal weakness, focal sensory changes and loss of consciousness.  Endo/Heme/Allergies: Does bruise/bleed easily.    EKGs/Labs/Other Studies Reviewed:    The following studies were reviewed today:  Treadmill nuclear 10/28/2018  Patient walked for 6:00 of a Bruce protocol GXt. Peak HR was 133 which is 88% .  At peak exercise, there were no ST or T wave changes to suggest ischemia.  Defect 1: There is a small defect of mild severity present in the apex location. This is likely due to artifact.  There is no evidence of ischemia. There is apical thinning but this is likely artifact. LV function information is not abailable due to inability to gate the study .  This  is a low risk study. LV EF information is not available. Suggest echocardiogram if not already performed  Echo 07/28/18 Study Conclusions  - Left ventricle: The cavity size was normal. Systolic function was   normal. The estimated ejection fraction was in the range of 55%   to 60%. Wall motion was normal; there were no regional wall   motion abnormalities. Left ventricular diastolic function   parameters were normal. - Aortic valve: Valve area (VTI): 1.63 cm^2. Valve area (Vmax):   1.48 cm^2. Valve area (Vmean): 1.61 cm^2. - Left atrium: The atrium was moderately to severely dilated. - Right atrium: The atrium was moderately dilated. - Tricuspid valve: Peak RV-RA gradient (S): 18 mm Hg. - Pulmonary arteries: PA peak pressure: 33 mm Hg (S).  Impressions:  -  Normal left and right ventricular chamber size and function, with   LVEF 55-60%. No hemodynamically significant valve disease.   Biatrial enlargement. RVSP approximately 27-32 mmHg.  Monitor 08/07/18 4 days, 1 hour of interpretable data on [facility]. No patient triggered events, and one auto recorded event of SVT. No sustained VT, pauses, or high degree AV block noted.  Patient had a min HR of 45 bpm, max HR of 141 bpm, and avg HR of 74 bpm. Predominant underlying rhythm was Sinus Rhythm. Intermittent Bundle Branch Block was present.   1 run of Supraventricular Tachycardia occurred lasting 6 beats with a max rate of 141 bpm (avg 134 bpm). Isolated SVEs were rare (<1.0%), SVE Couplets were rare (<1.0%), and SVE Triplets were rare (<1.0%). Isolated VEs were frequent (18.0%, [id number]), VE Couplets were rare (<1.0%, 452), and VE Triplets were rare (<1.0%, 7). Ventricular Bigeminy and Trigeminy were present.  EKG:  The ekg ordered previously demonstrates sinus rhythm with ventricular trigeminy  Recent Labs: 07/21/2018: ALT 13; BUN 22; Creatinine, Ser 0.82; Hemoglobin 12.3; Platelets 207.0; Potassium 3.7; Sodium 141; TSH 1.61  Recent  Lipid Panel    Component Value Date/Time   CHOL 195 07/21/2018 0914   TRIG 50.0 07/21/2018 0914   TRIG 54 08/26/2006 0925   HDL 77.00 07/21/2018 0914   CHOLHDL 3 07/21/2018 0914   VLDL 10.0 07/21/2018 0914   LDLCALC 108 (H) 07/21/2018 0914   LDLDIRECT 118.4 07/10/2012 0849    Physical Exam:    VS:  BP (!) 152/72   Temp (!) 97.3 F (36.3 C)   Ht 5\' 2"  (1.575 m)   Wt 141 lb 3.2 oz (64 kg)   SpO2 97%   BMI 25.83 kg/m     Wt Readings from Last 3 Encounters:  06/22/19 141 lb 3.2 oz (64 kg)  12/15/18 145 lb (65.8 kg)  10/28/18 139 lb (63 kg)    GEN: Well nourished, well developed in no acute distress HEENT: Normal, moist mucous membranes NECK: No JVD CARDIAC: regular rhythm with intermittent PVC, normal S1 and S2, no rubs, gallops. 2/6 HSM VASCULAR: Radial and DP pulses 2+ bilaterally. No carotid bruits RESPIRATORY:  Clear to auscultation without rales, wheezing or rhonchi  ABDOMEN: Soft, non-tender, non-distended MUSCULOSKELETAL:  Ambulates independently SKIN: Warm and dry, trace bilateral LE edema. No changes of chronic venous insufficiency. Very slight skin darkening, appears trivial. NEUROLOGIC:  Alert and oriented x 3. No focal neuro deficits noted. PSYCHIATRIC:  Normal affect   ASSESSMENT:    1. PVC (premature ventricular contraction)   2. Ventricular bigeminy   3. Essential hypertension   4. Murmur, cardiac   5. Counseling on health promotion and disease prevention    PLAN:    Palpitations, bigeminy/trigeminy, high PVC burden: appears as bradycardia on exam (and likely on personal activity trackers).  -Monitor shoes PVC burden of 18%, with occasional bigeminy and trigeminy. One episode of SVT as well.  -Echo without clear structural abnormality. -nuclear treadmill without evidence of ischemia -Improved since weaning caffeine, increasing her activity level -have discussed both beta blockers and calcium channels in the past. Wishes to hold on medications as she  is feeling better. Most worried about fatigue effect with beta blocker.  HTN: Denies BP ever being this high before. Due to see PCP in early October. I suspect with her PVCs that it will be difficult to get an accurate reading on an automatic cuff at home -with BP elevated and PVCs, would consider verapamil if treatment needed  for HTN  Murmur:  -MR/TR on echo, not severe  Prevention: The 10-year ASCVD risk score Mikey Bussing DC Jr., et al., 2013) is: 12.1%   Values used to calculate the score:     Age: 70 years     Sex: Female     Is Non-Hispanic African American: No     Diabetic: No     Tobacco smoker: No     Systolic Blood Pressure: 0000000 mmHg     Is BP treated: No     HDL Cholesterol: 77 mg/dL     Total Cholesterol: 195 mg/dL  Prevention: -recommend heart healthy/Mediterranean diet, with whole grains, fruits, vegetable, fish, lean meats, nuts, and olive oil. Limit salt. -recommend moderate walking, 3-5 times/week for 30-50 minutes each session. Aim for at least 150 minutes.week. Goal should be pace of 3 miles/hours, or walking 1.5 miles in 30 minutes -recommend avoidance of tobacco products. Avoid excess alcohol  Plan for follow up: 6 mos or sooner PRN  Medication Adjustments/Labs and Tests Ordered: Current medicines are reviewed at length with the patient today.  Concerns regarding medicines are outlined above.  No orders of the defined types were placed in this encounter.  No orders of the defined types were placed in this encounter.   Patient Instructions  Medication Instructions:  Your Physician recommend you continue on your current medication as directed.    If you need a refill on your cardiac medications before your next appointment, please call your pharmacy.   Lab work: None  Testing/Procedures: None  Follow-Up: At [facility], you and your health needs are our priority.  As part of our continuing mission to provide you with exceptional heart care, we have  created designated Provider Care Teams.  These Care Teams include your primary Cardiologist (physician) and Advanced Practice Providers (APPs -  Physician Assistants and Nurse Practitioners) who all work together to provide you with the care you need, when you need it. You will need a follow up appointment in 6 months.  Please call our office 2 months in advance to schedule this appointment.  You may see Buford Dresser, MD or one of the following Advanced Practice Providers on your designated Care Team:   Rosaria Ferries, PA-C . Jory Sims, DNP, ANP      Signed, Buford Dresser, MD PhD 06/22/2019  [facility]

## 2019-06-29 ENCOUNTER — Encounter: Payer: Self-pay | Admitting: Cardiology

## 2019-07-21 NOTE — Progress Notes (Addendum)
[facility] [place], [place],  [address] [phone]  Date:  07/23/2019   Name:  Marilyn Hess   DOB:  1946/01/17   MRN:  [id number]  PCP:  Darreld Mclean, MD    Chief Complaint: Annual Exam (flu shot)   History of Present Illness:  Marilyn Hess is a 73 y.o. very pleasant female patient who presents with the following:  Patient with history of hyperlipidemia, frequent PVCs, rosacea, osteopenia with increased hip fracture risk on antiresorptive therapy here today for physical exam Last seen by myself about 1 year ago She is adopted, so not much family history available  She enjoys walking for exercise, but seems to have injured her right foot.  She had been doing about 2.5 miles quickly up to 6 days a week.  She then got some pain in her foot.  She has had some pain in he foot for about 2 weeks.    She is walking more slowly and this does help Her pain may be worse in the am but not always  Just the right foot is painful-the left is okay She will get some leg cramps at night but NOT with exercise.  She has tried hydration, eating mustard and tumeric for her leg cramps  Current medications: Fosamax Aspirin Calcium vitamin D supplement  Flu shot- give today  Mammogram up-to-date Colonoscopy screening up-to-date DEXA 2018, can repeat this year- will order  Zostavax in 2017, too early for Shingrix.  Immunizations otherwise up-to-date Most recent labs about 1 year ago; she is fasting this am   Her cardiologist is Dr. Harrell Gave, she was seen at the end of August: Palpitations, bigeminy/trigeminy, high PVC burden: appears as bradycardia on exam (and likely on personal activity trackers).  -Monitor shoes PVC burden of 18%, with occasional bigeminy and trigeminy. One episode of SVT as well.  -Echo without clear structural abnormality. -nuclear treadmill without evidence of ischemia -Improved  since weaning caffeine, increasing her activity level -have discussed both beta blockers and calcium channels in the past. Wishes to hold on medications as she is feeling better. Most worried about fatigue effect with beta blocker. HTN: Denies BP ever being this high before. Due to see PCP in early October. I suspect with her PVCs that it will be difficult to get an accurate reading on an automatic cuff at home -with BP elevated and PVCs, would consider verapamil if treatment needed for HTN Murmur:  -MR/TR on echo, not severe  She does not check her BP at home generally  She is more bothered mentally than physically by her PVCs.  She wishes they would go away.  However they are not overly symptomatic Patient Active Problem List   Diagnosis Date Noted  . 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 Murmur, cardiac 07/23/2018  . PVC (premature ventricular contraction) 07/23/2018  . Hyperlipidemia 02/07/2015  . Overweight (BMI 25.0-29.9) 08/05/2014  . Osteopenia 07/10/2012  . Physical exam, annual 07/10/2012  . ROSACEA 12/22/2010  . CARPAL TUNNEL SYNDROME 10/20/2007  . 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 LACTOSE INTOLERANCE 01/16/2007  . BREAST MASS, BENIGN 01/16/2007    Past Medical History:  Diagnosis Date  . Breast lump   . Hyperlipidemia     Past Surgical History:  Procedure Laterality Date  . CARPAL TUNNEL RELEASE  11-2014  . TOOTH EXTRACTION  07-2011    Social History   Tobacco Use  . Smoking status: Never Smoker  .  Smokeless tobacco: Never Used  Substance Use Topics  . Alcohol use: No    Alcohol/week: 0.0 standard drinks  . Drug use: No    Family History  Adopted: Yes  Problem Relation Age of Onset  . Stroke Father   . Alcohol abuse Father   . Hypertension Father   . Alzheimer's disease Mother   . Hypertension Brother   . Colon cancer Neg Hx     Allergies  Allergen Reactions  . Cinnamon     Headache, breathing problem to long exposure    Medication list has been  reviewed and updated.  Current Outpatient Medications on File Prior to Visit  Medication Sig Dispense Refill  . alendronate (FOSAMAX) 70 MG tablet TAKE 1 TABLET BY MOUTH EVERY 7 DAYS. TAKE WITH A FULL GLASS OF WATER ON AN EMPTY STOMACH 12 tablet 4  . aspirin EC 81 MG tablet Take 81 mg by mouth daily.    . calcium carbonate (OS-CAL - DOSED IN MG OF ELEMENTAL CALCIUM) 1250 (500 CA) MG tablet Take 1 tablet by mouth. Reported on 10/05/2015    . calcium-vitamin D (OSCAL WITH D) 500-200 MG-UNIT tablet Take 1 tablet by mouth.    . metroNIDAZOLE (METROGEL) 1 % gel Apply 1 application topically daily. Reported on 10/05/2015 (Patient taking differently: Apply 1 application topically as needed. Reported on 10/05/2015) 45 g 6   No current facility-administered medications on file prior to visit.     Review of Systems:  As per HPI- otherwise negative. No CP or SOB with exercise No PMB She cut down a lot on caffeine - this has not really helped however with her PVCs Physical Examination: Vitals:   07/23/19 0829 07/23/19 0849  BP: (!) 142/80 138/80  Pulse: 60   Resp: 16   Temp: (!) 96.2 F (35.7 C)   SpO2: 96%    Vitals:   07/23/19 0829  Weight: 143 lb (64.9 kg)  Height: 5\' 2"  (1.575 m)   Body mass index is 26.16 kg/m. Ideal Body Weight: Weight in (lb) to have BMI = 25: 136.4  GEN: WDWN, NAD, Non-toxic, A & O x 3, normal weight, looks well  HEENT: Atraumatic, Normocephalic. Neck supple. No masses, No LAD.  Bilateral TM wnl, oropharynx normal.  PEERL,EOMI.   Ears and Nose: No external deformity. CV: RRR, No M/G/R. No JVD. No thrill. No extra heart sounds. PULM: CTA B, no wheezes, crackles, rhonchi. No retractions. No resp. distress. No accessory muscle use. ABD: S, NT, ND, +BS. No rebound. No HSM. EXTR: No c/c/e NEURO Normal gait.  PSYCH: Normally interactive. Conversant. Not depressed or anxious appearing.  Calm demeanor.  Right foot; no swelling or redness Strong pulses bilaterally   Bunion more so on the right, degen change of toes She is tender at the mid plantar fascia which is easily palpable   Assessment and Plan: Physical exam  Osteopenia, unspecified location - Plan: DG Bone Density  Rosacea  Dyslipidemia - Plan: Lipid panel  Screening for diabetes mellitus - Plan: Comprehensive metabolic panel, Hemoglobin A1c  Vitamin D deficiency - Plan: Vitamin D (25 hydroxy)  Screening for deficiency anemia - Plan: CBC  Immunization due  Ear itch - Plan: Ambulatory referral to ENT  Needs flu shot - Plan: Flu Vaccine QUAD High Dose(Fluad)  Estrogen deficiency - Plan: DG Bone Density  Plantar fasciitis, right  Here today for physical exam She notes long-term itching and irritation of her left ear, when the wind blows notes her left  ear will be painful.  I will refer her to ENT for consultation Otherwise labs pending as above Order DEXA scan  Suspect that her foot pain is due to plantar fasciitis.  Discussed ways of managing this including decreasing her exercise regimen temporarily, stretching the foot, using ice massage She will let me know if this is not getting better, or if getting worse Will plan further follow- up pending labs.  Signed Lamar Blinks, MD  Received her labs 10-2, letter to patient  Results for orders placed or performed in visit on 07/23/19  CBC  Result Value Ref Range   WBC 4.3 4.0 - 10.5 K/uL   RBC 4.31 3.87 - 5.11 Mil/uL   Platelets 222.0 150.0 - 400.0 K/uL   Hemoglobin 13.4 12.0 - 15.0 g/dL   HCT 40.8 36.0 - 46.0 %   MCV 94.8 78.0 - 100.0 fl   MCHC 32.8 30.0 - 36.0 g/dL   RDW 12.1 11.5 - 15.5 %  Comprehensive metabolic panel  Result Value Ref Range   Sodium 140 135 - 145 mEq/L   Potassium 4.6 3.5 - 5.1 mEq/L   Chloride 105 96 - 112 mEq/L   CO2 29 19 - 32 mEq/L   Glucose, Bld 89 70 - 99 mg/dL   BUN 21 6 - 23 mg/dL   Creatinine, Ser 0.78 0.40 - 1.20 mg/dL   Total Bilirubin 0.6 0.2 - 1.2 mg/dL   Alkaline Phosphatase  48 39 - 117 U/L   AST 25 0 - 37 U/L   ALT 29 0 - 35 U/L   Total Protein 6.0 6.0 - 8.3 g/dL   Albumin 4.0 3.5 - 5.2 g/dL   Calcium 9.1 8.4 - 10.5 mg/dL   GFR 72.80 >60.00 mL/min  Hemoglobin A1c  Result Value Ref Range   Hgb A1c MFr Bld 6.0 4.6 - 6.5 %  Lipid panel  Result Value Ref Range   Cholesterol 222 (H) 0 - 200 mg/dL   Triglycerides 52.0 0.0 - 149.0 mg/dL   HDL 83.50 >39.00 mg/dL   VLDL 10.4 0.0 - 40.0 mg/dL   LDL Cholesterol 128 (H) 0 - 99 mg/dL   Total CHOL/HDL Ratio 3    NonHDL 138.67   Vitamin D (25 hydroxy)  Result Value Ref Range   VITD 21.33 (L) 30.00 - 100.00 ng/mL   The 10-year ASCVD risk score Mikey Bussing DC Jr., et al., 2013) is: 11.7%   Values used to calculate the score:     Age: 38 years     Sex: Female     Is Non-Hispanic African American: No     Diabetic: No     Tobacco smoker: No     Systolic Blood Pressure: 0000000 mmHg     Is BP treated: No     HDL Cholesterol: 83.5 mg/dL     Total Cholesterol: 222 mg/dL

## 2019-07-21 NOTE — Patient Instructions (Addendum)
It was great to see you again today, I will be in touch with your labs ASAP You got your flu shot today Continue to stay mentally and physically active - I do think you may have some plantar fascitis contributing to your right foot pain. Please stretch your foot prior to rising in the am, and again as needed during the day.  An ice massage for the bottom of your foot after exercise.  You might need to temporarily scale back your exercise to allow your foot to heal, and then ramp back up I will refer you to ENT to look at your left ear and tonsils Continue to monitor your BP at home Let me know if you would like to be tested for sleep apnea at any time    Health Maintenance After Age 62 After age 2, you are at a higher risk for certain long-term diseases and infections as well as injuries from falls. Falls are a major cause of broken bones and head injuries in people who are older than age 63. Getting regular preventive care can help to keep you healthy and well. Preventive care includes getting regular testing and making lifestyle changes as recommended by your health care provider. Talk with your health care provider about:  Which screenings and tests you should have. A screening is a test that checks for a disease when you have no symptoms.  A diet and exercise plan that is right for you. What should I know about screenings and tests to prevent falls? Screening and testing are the best ways to find a health problem early. Early diagnosis and treatment give you the best chance of managing medical conditions that are common after age 39. Certain conditions and lifestyle choices may make you more likely to have a fall. Your health care provider may recommend:  Regular vision checks. Poor vision and conditions such as cataracts can make you more likely to have a fall. If you wear glasses, make sure to get your prescription updated if your vision changes.  Medicine review. Work with your health care  provider to regularly review all of the medicines you are taking, including over-the-counter medicines. Ask your health care provider about any side effects that may make you more likely to have a fall. Tell your health care provider if any medicines that you take make you feel dizzy or sleepy.  Osteoporosis screening. Osteoporosis is a condition that causes the bones to get weaker. This can make the bones weak and cause them to break more easily.  Blood pressure screening. Blood pressure changes and medicines to control blood pressure can make you feel dizzy.  Strength and balance checks. Your health care provider may recommend certain tests to check your strength and balance while standing, walking, or changing positions.  Foot health exam. Foot pain and numbness, as well as not wearing proper footwear, can make you more likely to have a fall.  Depression screening. You may be more likely to have a fall if you have a fear of falling, feel emotionally low, or feel unable to do activities that you used to do.  Alcohol use screening. Using too much alcohol can affect your balance and may make you more likely to have a fall. What actions can I take to lower my risk of falls? General instructions  Talk with your health care provider about your risks for falling. Tell your health care provider if: ? You fall. Be sure to tell your health care provider about  all falls, even ones that seem minor. ? You feel dizzy, sleepy, or off-balance.  Take over-the-counter and prescription medicines only as told by your health care provider. These include any supplements.  Eat a healthy diet and maintain a healthy weight. A healthy diet includes low-fat dairy products, low-fat (lean) meats, and fiber from whole grains, beans, and lots of fruits and vegetables. Home safety  Remove any tripping hazards, such as rugs, cords, and clutter.  Install safety equipment such as grab bars in bathrooms and safety rails  on stairs.  Keep rooms and walkways well-lit. Activity   Follow a regular exercise program to stay fit. This will help you maintain your balance. Ask your health care provider what types of exercise are appropriate for you.  If you need a cane or walker, use it as recommended by your health care provider.  Wear supportive shoes that have nonskid soles. Lifestyle  Do not drink alcohol if your health care provider tells you not to drink.  If you drink alcohol, limit how much you have: ? 0-1 drink a day for women. ? 0-2 drinks a day for men.  Be aware of how much alcohol is in your drink. In the [place] one drink equals one typical bottle of beer (12 oz), one-half glass of wine (5 oz), or one shot of hard liquor (1 oz).  Do not use any products that contain nicotine or tobacco, such as cigarettes and e-cigarettes. If you need help quitting, ask your health care provider. Summary  Having a healthy lifestyle and getting preventive care can help to protect your health and wellness after age 71.  Screening and testing are the best way to find a health problem early and help you avoid having a fall. Early diagnosis and treatment give you the best chance for managing medical conditions that are more common for people who are older than age 35.  Falls are a major cause of broken bones and head injuries in people who are older than age 54. Take precautions to prevent a fall at home.  Work with your health care provider to learn what changes you can make to improve your health and wellness and to prevent falls. This information is not intended to replace advice given to you by your health care provider. Make sure you discuss any questions you have with your health care provider. Document Released: 08/21/2017 Document Revised: 01/29/2019 Document Reviewed: 08/21/2017 Elsevier Patient Education  2020 [employer].

## 2019-07-23 ENCOUNTER — Other Ambulatory Visit: Payer: Self-pay

## 2019-07-23 ENCOUNTER — Ambulatory Visit (INDEPENDENT_AMBULATORY_CARE_PROVIDER_SITE_OTHER): Payer: Medicare Other | Admitting: Family Medicine

## 2019-07-23 ENCOUNTER — Encounter: Payer: Self-pay | Admitting: Family Medicine

## 2019-07-23 VITALS — BP 138/80 | HR 60 | Temp 96.2°F | Resp 16 | Ht 62.0 in | Wt 143.0 lb

## 2019-07-23 DIAGNOSIS — E785 Hyperlipidemia, unspecified: Secondary | ICD-10-CM | POA: Diagnosis not present

## 2019-07-23 DIAGNOSIS — E559 Vitamin D deficiency, unspecified: Secondary | ICD-10-CM

## 2019-07-23 DIAGNOSIS — Z131 Encounter for screening for diabetes mellitus: Secondary | ICD-10-CM | POA: Diagnosis not present

## 2019-07-23 DIAGNOSIS — Z0000 Encounter for general adult medical examination without abnormal findings: Secondary | ICD-10-CM

## 2019-07-23 DIAGNOSIS — M8580 Other specified disorders of bone density and structure, unspecified site: Secondary | ICD-10-CM | POA: Diagnosis not present

## 2019-07-23 DIAGNOSIS — L299 Pruritus, unspecified: Secondary | ICD-10-CM

## 2019-07-23 DIAGNOSIS — R7303 Prediabetes: Secondary | ICD-10-CM

## 2019-07-23 DIAGNOSIS — Z23 Encounter for immunization: Secondary | ICD-10-CM | POA: Diagnosis not present

## 2019-07-23 DIAGNOSIS — L719 Rosacea, unspecified: Secondary | ICD-10-CM | POA: Diagnosis not present

## 2019-07-23 DIAGNOSIS — M722 Plantar fascial fibromatosis: Secondary | ICD-10-CM

## 2019-07-23 DIAGNOSIS — E2839 Other primary ovarian failure: Secondary | ICD-10-CM

## 2019-07-23 DIAGNOSIS — Z130 Encounter for screening for diseases of the blood and blood-forming organs and certain disorders involving the immune mechanism: Secondary | ICD-10-CM | POA: Diagnosis not present

## 2019-07-23 LAB — HEMOGLOBIN A1C: Hgb A1c MFr Bld: 6 % (ref 4.6–6.5)

## 2019-07-23 LAB — COMPREHENSIVE METABOLIC PANEL
ALT: 29 U/L (ref 0–35)
AST: 25 U/L (ref 0–37)
Albumin: 4 g/dL (ref 3.5–5.2)
Alkaline Phosphatase: 48 U/L (ref 39–117)
BUN: 21 mg/dL (ref 6–23)
CO2: 29 mEq/L (ref 19–32)
Calcium: 9.1 mg/dL (ref 8.4–10.5)
Chloride: 105 mEq/L (ref 96–112)
Creatinine, Ser: 0.78 mg/dL (ref 0.40–1.20)
GFR: 72.8 mL/min (ref >60.00)
Glucose, Bld: 89 mg/dL (ref 70–99)
Potassium: 4.6 mEq/L (ref 3.5–5.1)
Sodium: 140 mEq/L (ref 135–145)
Total Bilirubin: 0.6 mg/dL (ref 0.2–1.2)
Total Protein: 6 g/dL (ref 6.0–8.3)

## 2019-07-23 LAB — CBC
HCT: 40.8 % (ref 36.0–46.0)
Hemoglobin: 13.4 g/dL (ref 12.0–15.0)
MCHC: 32.8 g/dL (ref 30.0–36.0)
MCV: 94.8 fl (ref 78.0–100.0)
Platelets: 222 10*3/uL (ref 150.0–400.0)
RBC: 4.31 Mil/uL (ref 3.87–5.11)
RDW: 12.1 % (ref 11.5–15.5)
WBC: 4.3 10*3/uL (ref 4.0–10.5)

## 2019-07-23 LAB — LIPID PANEL
Cholesterol: 222 mg/dL — ABNORMAL HIGH (ref 0–200)
HDL: 83.5 mg/dL (ref >39.00)
LDL Cholesterol: 128 mg/dL — ABNORMAL HIGH (ref 0–99)
NonHDL: 138.67
Total CHOL/HDL Ratio: 3
Triglycerides: 52 mg/dL (ref 0.0–149.0)
VLDL: 10.4 mg/dL (ref 0.0–40.0)

## 2019-07-23 LAB — VITAMIN D 25 HYDROXY (VIT D DEFICIENCY, FRACTURES): VITD: 21.33 ng/mL — ABNORMAL LOW (ref 30.00–100.00)

## 2019-07-24 DIAGNOSIS — R7303 Prediabetes: Secondary | ICD-10-CM | POA: Insufficient documentation

## 2019-07-24 DIAGNOSIS — E559 Vitamin D deficiency, unspecified: Secondary | ICD-10-CM | POA: Insufficient documentation

## 2019-07-27 ENCOUNTER — Other Ambulatory Visit: Payer: Self-pay | Admitting: Family Medicine

## 2019-07-27 MED ORDER — VITAMIN D3 1.25 MG (50000 UT) PO CAPS: ORAL_CAPSULE | ORAL | 0 refills | Status: DC

## 2019-07-28 ENCOUNTER — Ambulatory Visit (HOSPITAL_BASED_OUTPATIENT_CLINIC_OR_DEPARTMENT_OTHER)
Admission: RE | Admit: 2019-07-28 | Discharge: 2019-07-28 | Disposition: A | Discharge status: Home / Self Care | Payer: Medicare Other | Source: Ambulatory Visit | Attending: Family Medicine | Admitting: Family Medicine

## 2019-07-28 ENCOUNTER — Other Ambulatory Visit: Payer: Self-pay

## 2019-07-28 DIAGNOSIS — M8580 Other specified disorders of bone density and structure, unspecified site: Secondary | ICD-10-CM | POA: Diagnosis not present

## 2019-07-28 DIAGNOSIS — E2839 Other primary ovarian failure: Secondary | ICD-10-CM

## 2019-07-31 ENCOUNTER — Encounter: Payer: Self-pay | Admitting: Family Medicine

## 2019-07-31 NOTE — Progress Notes (Signed)
Letter sent.

## 2019-08-05 DIAGNOSIS — H608X2 Other otitis externa, left ear: Secondary | ICD-10-CM | POA: Insufficient documentation

## 2019-08-05 DIAGNOSIS — L299 Pruritus, unspecified: Secondary | ICD-10-CM | POA: Insufficient documentation

## 2019-08-06 NOTE — Telephone Encounter (Signed)
This encounter was created in error - please disregard.

## 2019-12-01 ENCOUNTER — Telehealth: Payer: Self-pay | Admitting: *Deleted

## 2019-12-01 NOTE — Telephone Encounter (Signed)
A message was left, re: her follow up visit. 

## 2019-12-22 ENCOUNTER — Other Ambulatory Visit: Payer: Self-pay

## 2019-12-22 ENCOUNTER — Ambulatory Visit: Payer: Medicare PPO | Admitting: Cardiology

## 2019-12-22 ENCOUNTER — Encounter: Payer: Self-pay | Admitting: Cardiology

## 2019-12-22 VITALS — BP 136/64 | HR 88 | Ht 62.0 in | Wt 146.4 lb

## 2019-12-22 DIAGNOSIS — Z7189 Other specified counseling: Secondary | ICD-10-CM

## 2019-12-22 DIAGNOSIS — R030 Elevated blood-pressure reading, without diagnosis of hypertension: Secondary | ICD-10-CM

## 2019-12-22 DIAGNOSIS — R002 Palpitations: Secondary | ICD-10-CM

## 2019-12-22 DIAGNOSIS — R011 Cardiac murmur, unspecified: Secondary | ICD-10-CM | POA: Diagnosis not present

## 2019-12-22 DIAGNOSIS — I493 Ventricular premature depolarization: Secondary | ICD-10-CM

## 2019-12-22 NOTE — Patient Instructions (Signed)
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
## 2019-12-27 ENCOUNTER — Encounter: Payer: Self-pay | Admitting: Cardiology

## 2020-03-08 ENCOUNTER — Other Ambulatory Visit: Payer: Self-pay | Admitting: Family Medicine

## 2020-03-08 DIAGNOSIS — M85859 Other specified disorders of bone density and structure, unspecified thigh: Secondary | ICD-10-CM

## 2020-04-13 NOTE — Progress Notes (Signed)
I connected with [name] today by telephone and verified that I am speaking with the correct person using two identifiers. Location patient: home Location provider: work Persons participating in the virtual visit: patient, Marine scientist.    I discussed the limitations, risks, security and privacy concerns of performing an evaluation and management service by telephone and the availability of in person appointments. I also discussed with the patient that there may be a patient responsible charge related to this service. The patient expressed understanding and verbally consented to this telephonic visit.    Interactive audio and video telecommunications were attempted between this provider and patient, however failed, due to patient having technical difficulties OR patient did not have access to video capability.  We continued and completed visit with audio only.  Some vital signs may be absent or patient reported.    Subjective:   Marilyn Hess is a 74 y.o. female who presents for Medicare Annual (Subsequent) preventive examination.  Review of Systems     Cardiac Risk Factors include: dyslipidemia;advanced age (>44men, >5 women)     Objective:     Advanced Directives 04/14/2020 04/13/2019 04/11/2018 04/08/2017 09/21/2015  Does Patient Have a Medical Advance Directive? No No No No No  Does patient want to make changes to medical advance directive? No - Patient declined - - - -  Would patient like information on creating a medical advance directive? - No - Patient declined Yes (MAU/Ambulatory/Procedural Areas - Information given) Yes (MAU/Ambulatory/Procedural Areas - Information given) No - patient declined information    Current Medications (verified) Outpatient Encounter Medications as of 04/14/2020  Medication Sig  . alendronate (FOSAMAX) 70 MG tablet TAKE 1 TABLET BY MOUTH ONCE A WEEK WITH  A  FULL  GLASS  OF  WATER  ON  AN  EMPTY  STOMACH  . aspirin EC 81 MG tablet Take 81 mg by mouth  daily.  . calcium carbonate (OS-CAL - DOSED IN MG OF ELEMENTAL CALCIUM) 1250 (500 CA) MG tablet Take 1 tablet by mouth. Reported on 10/05/2015  . calcium-vitamin D (OSCAL WITH D) 500-200 MG-UNIT tablet Take 1 tablet by mouth.  . Cholecalciferol (VITAMIN D3) 1.25 MG (50000 UT) CAPS Take 1 weekly for 12 weeks  . metroNIDAZOLE (METROGEL) 1 % gel Apply 1 application topically daily. Reported on 10/05/2015   No facility-administered encounter medications on file as of 04/14/2020.    Allergies (verified) Cinnamon   History: Past Medical History:  Diagnosis Date  . Breast lump   . Hyperlipidemia    Past Surgical History:  Procedure Laterality Date  . CARPAL TUNNEL RELEASE  11-2014  . TOOTH EXTRACTION  07-2011   Family History  Adopted: Yes  Problem Relation Age of Onset  . Stroke Father   . Alcohol abuse Father   . Hypertension Father   . Alzheimer's disease Mother   . Hypertension Brother   . Colon cancer Neg Hx    Social History   Socioeconomic History  . Marital status: Divorced    Spouse name: Not on file  . Number of children: Not on file  . Years of education: Not on file  . Highest education level: Not on file  Occupational History  . Not on file  Tobacco Use  . Smoking status: Never Smoker  . Smokeless tobacco: Never Used  Substance and Sexual Activity  . Alcohol use: No    Alcohol/week: 0.0 standard drinks  . Drug use: No  . Sexual activity: Never  Other Topics Concern  .  Not on file  Social History Narrative  . Not on file   Social Determinants of Health   Financial Resource Strain: Low Risk   . Difficulty of Paying Living Expenses: Not hard at all  Food Insecurity: No Food Insecurity  . Worried About Charity fundraiser in the Last Year: Never true  . Ran Out of Food in the Last Year: Never true  Transportation Needs: No Transportation Needs  . Lack of Transportation (Medical): No  . Lack of Transportation (Non-Medical): No  Physical Activity:   .  Days of Exercise per Week:   . Minutes of Exercise per Session:   Stress:   . Feeling of Stress :   Social Connections:   . Frequency of Communication with Friends and Family:   . Frequency of Social Gatherings with Friends and Family:   . Attends Religious Services:   . Active Member of Clubs or Organizations:   . Attends Archivist Meetings:   [name] Kitchen Marital Status:     Tobacco Counseling Counseling given: Not Answered   Clinical Intake: Pain : No/denies pain    Activities of Daily Living In your present state of health, do you have any difficulty performing the following activities: 04/14/2020  Hearing? N  Vision? N  Difficulty concentrating or making decisions? N  Walking or climbing stairs? N  Dressing or bathing? N  Doing errands, shopping? N  Preparing Food and eating ? N  Using the Toilet? N  In the past six months, have you accidently leaked urine? N  Do you have problems with loss of bowel control? N  Managing your Medications? N  Managing your Finances? N  Housekeeping or managing your Housekeeping? N  Some recent data might be hidden    Patient Care Team: Copland, Gay Filler, MD as PCP - General (Family Medicine) Buford Dresser, MD as PCP - Cardiology (Cardiology) Haverstock, Jennefer Bravo, MD as Referring Physician (Dermatology)  Indicate any recent Medical Services you may have received from other than [facility] providers in the past year (date may be approximate).     Assessment:   This is a routine wellness examination for Marilyn Hess.   Dietary issues and exercise activities discussed: Current Exercise Habits: Home exercise routine, Type of exercise: walking, Time (Minutes): 20, Frequency (Times/Week): 5, Weekly Exercise (Minutes/Week): 100, Intensity: Mild, Exercise limited by: None identified Diet (meal preparation, eat out, water intake, caffeinated beverages, dairy products, fruits and vegetables): well balanced   Goals    .  Begin  oraganizing home (pt-stated)    .  Continue exercising (pt-stated)    .  DIET - INCREASE WATER INTAKE      Depression Screen PHQ 2/9 Scores 04/14/2020 04/13/2019 04/11/2018 04/08/2017 08/08/2015 08/05/2014  PHQ - 2 Score 0 0 0 0 0 0    Fall Risk Fall Risk  04/14/2020 04/13/2019 04/11/2018 04/08/2017 08/08/2015  Falls in the past year? 0 0 No No No  Number falls in past yr: 0 - - - -  Injury with Fall? 0 - - - -  Follow up Education provided;Falls prevention discussed - - - -    Any stairs in or around the home? Yes  If so, are there any without handrails? No  Home free of loose throw rugs in walkways, pet beds, electrical cords, etc? Yes  Adequate lighting in your home to reduce risk of falls? Yes   ASSISTIVE DEVICES UTILIZED TO PREVENT FALLS:  Life alert? No  Use of a cane, walker  or w/c? No  Grab bars in the bathroom? No  Shower chair or bench in shower? No  Elevated toilet seat or a handicapped toilet? Yes     Cognitive Function: Ad8 score reviewed for issues:  Issues making decisions:no  Less interest in hobbies / activities:no  Repeats questions, stories (family complaining):no  Trouble using ordinary gadgets (microwave, computer, phone):no  Forgets the month or year: no  Mismanaging finances: no  Remembering appts:no  Daily problems with thinking and/or memory:no Ad8 score is=0          Immunizations Immunization History  Administered Date(s) Administered  . Fluad Quad(high Dose 65+) 07/23/2019  . Influenza, High Dose Seasonal PF 07/08/2017, 07/21/2018  . Influenza,inj,Quad PF,6+ Mos 08/05/2013, 08/08/2015  . PFIZER SARS-COV-2 Vaccination 11/30/2019, 12/18/2019  . Pneumococcal Conjugate-13 08/05/2014  . Pneumococcal Polysaccharide-23 08/08/2015  . Tdap 06/07/2015  . Zoster 02/21/2016    TDAP status: Up to date Flu Vaccine status: Up to date Pneumococcal vaccine status: Up to date Covid-19 vaccine status: Completed vaccines  Qualifies for  Shingles Vaccine?   Zostavax completed Yes     Screening Tests Health Maintenance  Topic Date Due  . INFLUENZA VACCINE  05/22/2020  . MAMMOGRAM  06/10/2020  . COLONOSCOPY  10/04/2020  . TETANUS/TDAP  06/06/2025  . DEXA SCAN  Completed  . COVID-19 Vaccine  Completed  . Hepatitis C Screening  Completed  . PNA vac Low Risk Adult  Completed    Health Maintenance  There are no preventive care reminders to display for this patient.  Colorectal cancer screening: Completed 10/05/15. Repeat every 5 years Mammogram status: Completed 06/12/19. Repeat every year Bone Density status: Completed 07/29/19. Results reflect: Bone density results: OSTEOPENIA. Repeat every 2 years.  Lung Cancer Screening: (Low Dose CT Chest recommended if Age 86-80 years, 30 pack-year currently smoking OR have quit w/in 15years.) does not qualify.   Additional Screening:  Hepatitis C Screening:  Completed 07/08/17  Vision Screening: Recommended annual ophthalmology exams for early detection of glaucoma and other disorders of the eye. Is the patient up to date with their annual eye exam?  Yes  Who is the provider or what is the name of the office in which the patient attends annual eye exams? Dr.Burns   Dental Screening: Recommended annual dental exams for proper oral hygiene  Community Resource Referral / Chronic Care Management: CRR required this visit?  No   CCM required this visit?  No      Plan:    See you next year!   Keep up the great work!  I have personally reviewed and noted the following in the patient's chart:   . Medical and social history . Use of alcohol, tobacco or illicit drugs  . Current medications and supplements . Functional ability and status . Nutritional status . Physical activity . Advanced directives . List of other physicians . Hospitalizations, surgeries, and ER visits in previous 12 months . Vitals . Screenings to include cognitive, depression, and  falls . Referrals and appointments  In addition, I have reviewed and discussed with patient certain preventive protocols, quality metrics, and best practice recommendations. A written personalized care plan for preventive services as well as general preventive health recommendations were provided to patient.   Due to this being a telephonic visit, the after visit summary with patients personalized plan was offered to patient via mail or my-chart.  Patient would like to access on my-chart   Shela Nevin, [place]   04/14/2020

## 2020-04-14 ENCOUNTER — Other Ambulatory Visit: Payer: Self-pay

## 2020-04-14 ENCOUNTER — Ambulatory Visit (INDEPENDENT_AMBULATORY_CARE_PROVIDER_SITE_OTHER): Payer: Medicare PPO | Admitting: *Deleted

## 2020-04-14 ENCOUNTER — Encounter: Payer: Self-pay | Admitting: *Deleted

## 2020-04-14 DIAGNOSIS — Z0000 Encounter for general adult medical examination without abnormal findings: Secondary | ICD-10-CM

## 2020-04-14 NOTE — Patient Instructions (Signed)
Ms. Marilyn Hess , Thank you for taking time to come for your Medicare Wellness Visit. I appreciate your ongoing commitment to your health goals. Please review the following plan we discussed and let me know if I can assist you in the future.   Screening recommendations/referrals: Colorectal cancer screening: Completed 10/05/15. Repeat every 5 years Mammogram status: Completed 06/12/19. Repeat every year Bone Density status: Completed 07/29/19. Results reflect: Bone density results: OSTEOPENIA. Repeat every 2 years. Recommended yearly ophthalmology/optometry visit for glaucoma screening and checkup Recommended yearly dental visit for hygiene and checkup  Vaccinations: TDAP status: Up to date Flu Vaccine status: Up to date Pneumococcal vaccine status: Up to date Covid-19 vaccine status: Completed vaccines  Advanced directives: Bring a copy of your living will and/or healthcare power of attorney to your next office visit.  Next appointment: Follow up in one year for your annual wellness visit    Preventive Care 65 Years and Older, Female Preventive care refers to lifestyle choices and visits with your health care provider that can promote health and wellness. What does preventive care include?  A yearly physical exam. This is also called an annual well check.  Dental exams once or twice a year.  Routine eye exams. Ask your health care provider how often you should have your eyes checked.  Personal lifestyle choices, including:  Daily care of your teeth and gums.  Regular physical activity.  Eating a healthy diet.  Avoiding tobacco and drug use.  Limiting alcohol use.  Practicing safe sex.  Taking low-dose aspirin every day.  Taking vitamin and mineral supplements as recommended by your health care provider. What happens during an annual well check? The services and screenings done by your health care provider during your annual well check will depend on your age, overall  health, lifestyle risk factors, and family history of disease. Counseling  Your health care provider may ask you questions about your:  Alcohol use.  Tobacco use.  Drug use.  Emotional well-being.  Home and relationship well-being.  Sexual activity.  Eating habits.  History of falls.  Memory and ability to understand (cognition).  Work and work Statistician.  Reproductive health. Screening  You may have the following tests or measurements:  Height, weight, and BMI.  Blood pressure.  Lipid and cholesterol levels. These may be checked every 5 years, or more frequently if you are over 20 years old.  Skin check.  Lung cancer screening. You may have this screening every year starting at age 51 if you have a 30-pack-year history of smoking and currently smoke or have quit within the past 15 years.  Fecal occult blood test (FOBT) of the stool. You may have this test every year starting at age 101.  Flexible sigmoidoscopy or colonoscopy. You may have a sigmoidoscopy every 5 years or a colonoscopy every 10 years starting at age 62.  Hepatitis C blood test.  Hepatitis B blood test.  Sexually transmitted disease (STD) testing.  Diabetes screening. This is done by checking your blood sugar (glucose) after you have not eaten for a while (fasting). You may have this done every 1-3 years.  Bone density scan. This is done to screen for osteoporosis. You may have this done starting at age 79.  Mammogram. This may be done every 1-2 years. Talk to your health care provider about how often you should have regular mammograms. Talk with your health care provider about your test results, treatment options, and if necessary, the need for more tests. Vaccines  Your health care provider may recommend certain vaccines, such as:  Influenza vaccine. This is recommended every year.  Tetanus, diphtheria, and acellular pertussis (Tdap, Td) vaccine. You may need a Td booster every 10  years.  Zoster vaccine. You may need this after age 72.  Pneumococcal 13-valent conjugate (PCV13) vaccine. One dose is recommended after age 14.  Pneumococcal polysaccharide (PPSV23) vaccine. One dose is recommended after age 45. Talk to your health care provider about which screenings and vaccines you need and how often you need them. This information is not intended to replace advice given to you by your health care provider. Make sure you discuss any questions you have with your health care provider. Document Released: 11/04/2015 Document Revised: 06/27/2016 Document Reviewed: 08/09/2015 Elsevier Interactive Patient Education  2017 [place] Prevention in the Home Falls can cause injuries. They can happen to people of all ages. There are many things you can do to make your home safe and to help prevent falls. What can I do on the outside of my home?  Regularly fix the edges of walkways and driveways and fix any cracks.  Remove anything that might make you trip as you walk through a door, such as a raised step or threshold.  Trim any bushes or trees on the path to your home.  Use bright outdoor lighting.  Clear any walking paths of anything that might make someone trip, such as rocks or tools.  Regularly check to see if handrails are loose or broken. Make sure that both sides of any steps have handrails.  Any raised decks and porches should have guardrails on the edges.  Have any leaves, snow, or ice cleared regularly.  Use sand or salt on walking paths during winter.  Clean up any spills in your garage right away. This includes oil or grease spills. What can I do in the bathroom?  Use night lights.  Install grab bars by the toilet and in the tub and shower. Do not use towel bars as grab bars.  Use non-skid mats or decals in the tub or shower.  If you need to sit down in the shower, use a plastic, non-slip stool.  Keep the floor dry. Clean up any water that  spills on the floor as soon as it happens.  Remove soap buildup in the tub or shower regularly.  Attach bath mats securely with double-sided non-slip rug tape.  Do not have throw rugs and other things on the floor that can make you trip. What can I do in the bedroom?  Use night lights.  Make sure that you have a light by your bed that is easy to reach.  Do not use any sheets or blankets that are too big for your bed. They should not hang down onto the floor.  Have a firm chair that has side arms. You can use this for support while you get dressed.  Do not have throw rugs and other things on the floor that can make you trip. What can I do in the kitchen?  Clean up any spills right away.  Avoid walking on wet floors.  Keep items that you use a lot in easy-to-reach places.  If you need to reach something above you, use a strong step stool that has a grab bar.  Keep electrical cords out of the way.  Do not use floor polish or wax that makes floors slippery. If you must use wax, use non-skid floor wax.  Do  not have throw rugs and other things on the floor that can make you trip. What can I do with my stairs?  Do not leave any items on the stairs.  Make sure that there are handrails on both sides of the stairs and use them. Fix handrails that are broken or loose. Make sure that handrails are as long as the stairways.  Check any carpeting to make sure that it is firmly attached to the stairs. Fix any carpet that is loose or worn.  Avoid having throw rugs at the top or bottom of the stairs. If you do have throw rugs, attach them to the floor with carpet tape.  Make sure that you have a light switch at the top of the stairs and the bottom of the stairs. If you do not have them, ask someone to add them for you. What else can I do to help prevent falls?  Wear shoes that:  Do not have high heels.  Have rubber bottoms.  Are comfortable and fit you well.  Are closed at the  toe. Do not wear sandals.  If you use a stepladder:  Make sure that it is fully opened. Do not climb a closed stepladder.  Make sure that both sides of the stepladder are locked into place.  Ask someone to hold it for you, if possible.  Clearly mark and make sure that you can see:  Any grab bars or handrails.  First and last steps.  Where the edge of each step is.  Use tools that help you move around (mobility aids) if they are needed. These include:  Canes.  Walkers.  Scooters.  Crutches.  Turn on the lights when you go into a dark area. Replace any light bulbs as soon as they burn out.  Set up your furniture so you have a clear path. Avoid moving your furniture around.  If any of your floors are uneven, fix them.  If there are any pets around you, be aware of where they are.  Review your medicines with your doctor. Some medicines can make you feel dizzy. This can increase your chance of falling. Ask your doctor what other things that you can do to help prevent falls. This information is not intended to replace advice given to you by your health care provider. Make sure you discuss any questions you have with your health care provider. Document Released: 08/04/2009 Document Revised: 03/15/2016 Document Reviewed: 11/12/2014 Elsevier Interactive Patient Education  2017 [employer].

## 2020-05-31 ENCOUNTER — Other Ambulatory Visit: Payer: Self-pay | Admitting: Family Medicine

## 2020-05-31 DIAGNOSIS — M85859 Other specified disorders of bone density and structure, unspecified thigh: Secondary | ICD-10-CM

## 2020-08-06 ENCOUNTER — Ambulatory Visit: Payer: Medicare PPO | Attending: Internal Medicine

## 2020-08-06 DIAGNOSIS — Z23 Encounter for immunization: Secondary | ICD-10-CM

## 2020-08-06 NOTE — Progress Notes (Signed)
   Covid-19 Vaccination Clinic  Name:  Starkisha Tullis    MRN: [id number] DOB: 23-Mar-1946  08/06/2020  Ms. Barna was observed post Covid-19 immunization for 15 minutes without incident. She was provided with Vaccine Information Sheet and instruction to access the V-Safe system.   Ms. Mandel was instructed to call 911 with any severe reactions post vaccine: [name] Kitchen Difficulty breathing  . Swelling of face and throat  . A fast heartbeat  . A bad rash all over body  . Dizziness and weakness

## 2020-08-31 ENCOUNTER — Other Ambulatory Visit: Payer: Self-pay | Admitting: Family Medicine

## 2020-08-31 DIAGNOSIS — M85859 Other specified disorders of bone density and structure, unspecified thigh: Secondary | ICD-10-CM

## 2020-09-29 ENCOUNTER — Encounter: Payer: Self-pay | Admitting: Family Medicine

## 2020-09-29 DIAGNOSIS — E119 Type 2 diabetes mellitus without complications: Secondary | ICD-10-CM | POA: Diagnosis not present

## 2020-09-29 LAB — HM DIABETES EYE EXAM

## 2020-10-08 IMAGING — MG DIGITAL SCREENING BILATERAL MAMMOGRAM WITH TOMO AND CAD
8 series · 8 of 24 positions shown · non-contrast
Comparison: Previous exam(s).

CLINICAL DATA: Screening.

EXAM:
DIGITAL SCREENING BILATERAL MAMMOGRAM WITH TOMO AND CAD

[R MLO synth-2D]
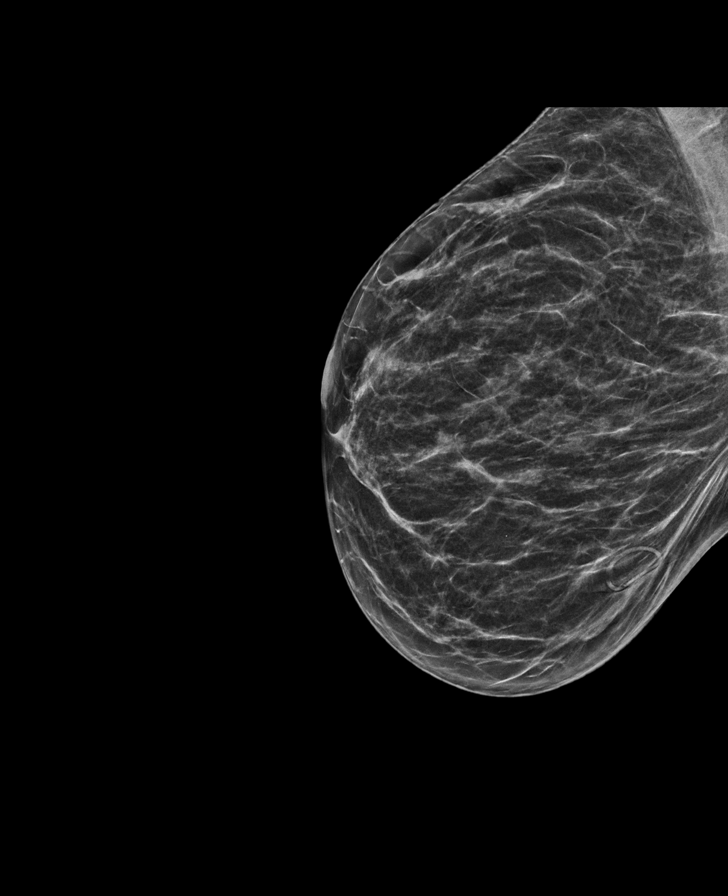

[L CC synth-2D]
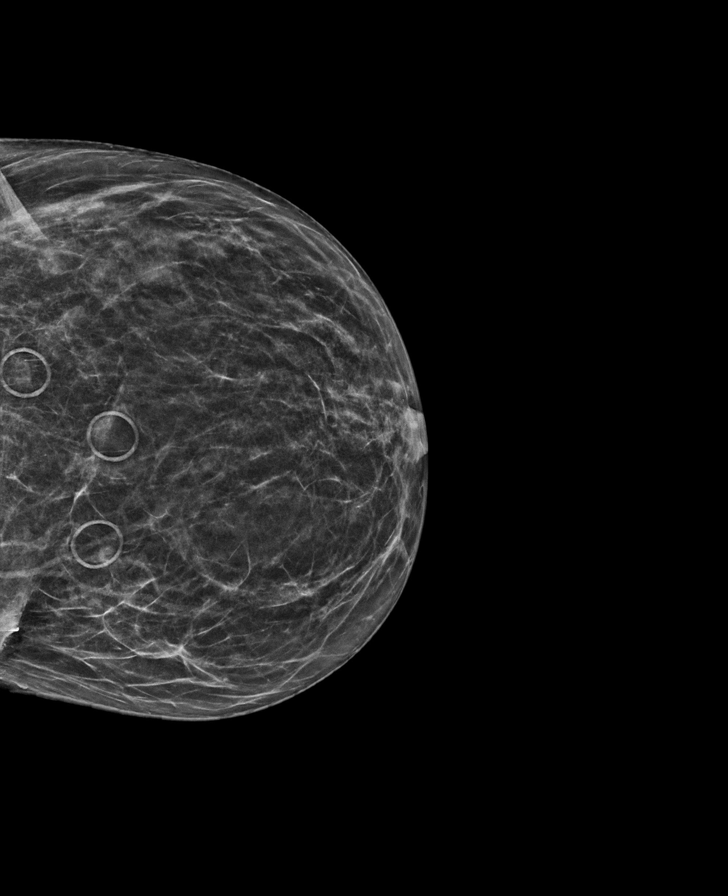

[L MLO synth-2D]
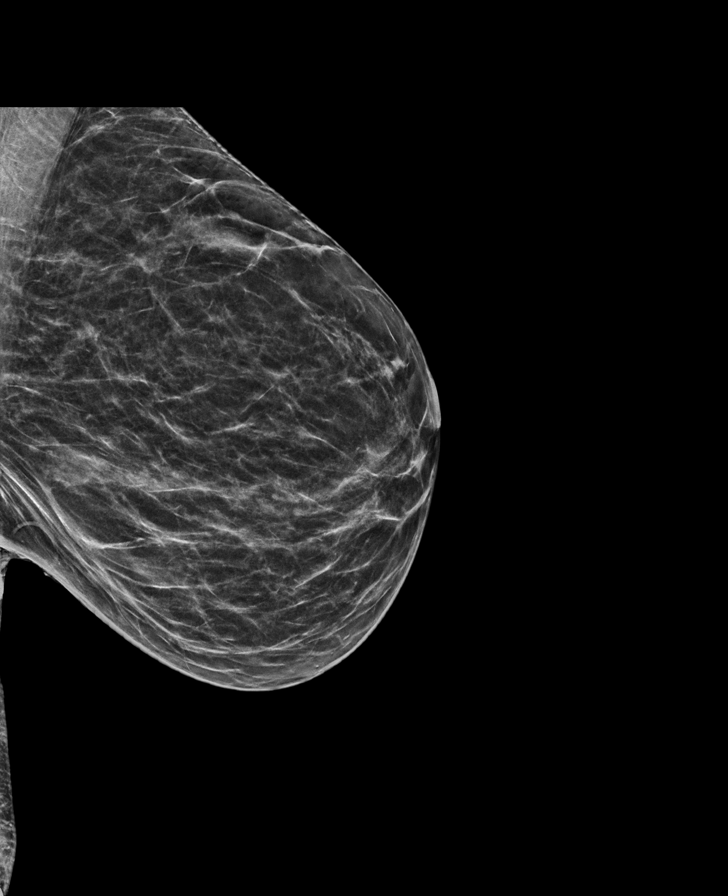

[R CC synth-2D]
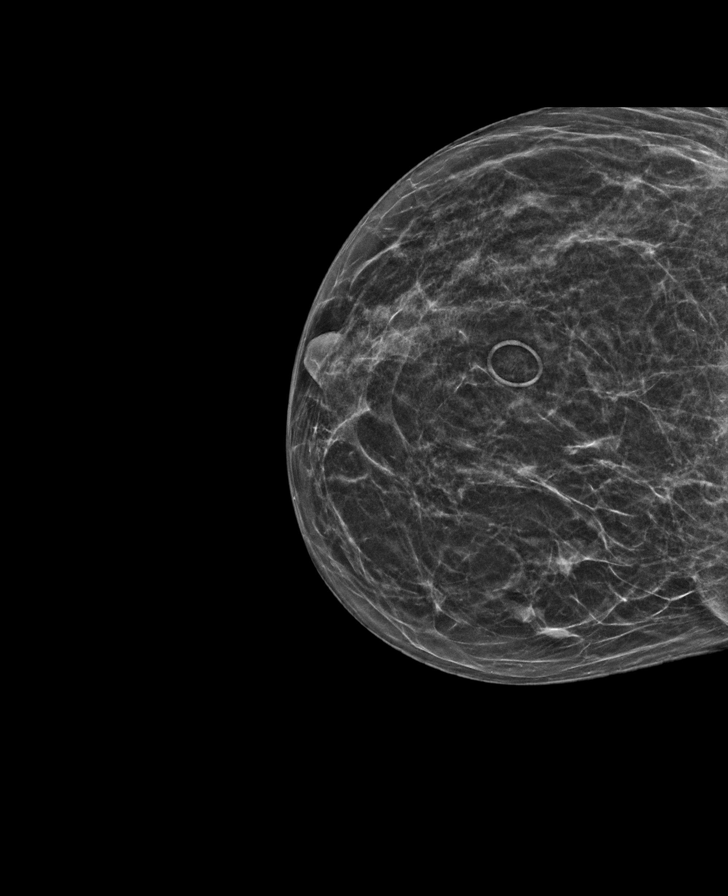

[L CC tomo · tomo slice 27/52.0]
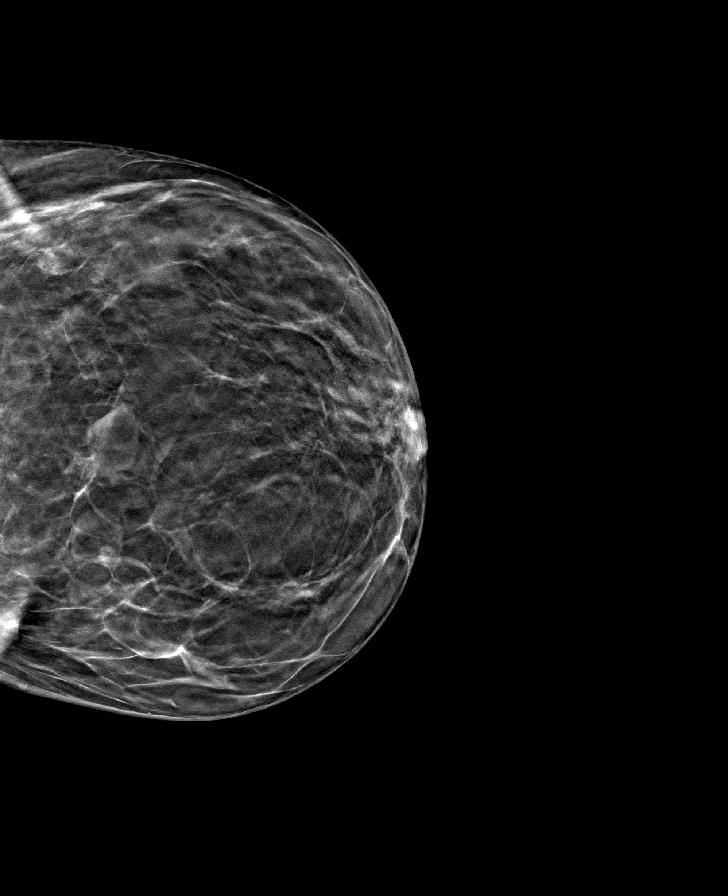

[R CC tomo · tomo slice 25/48.0]
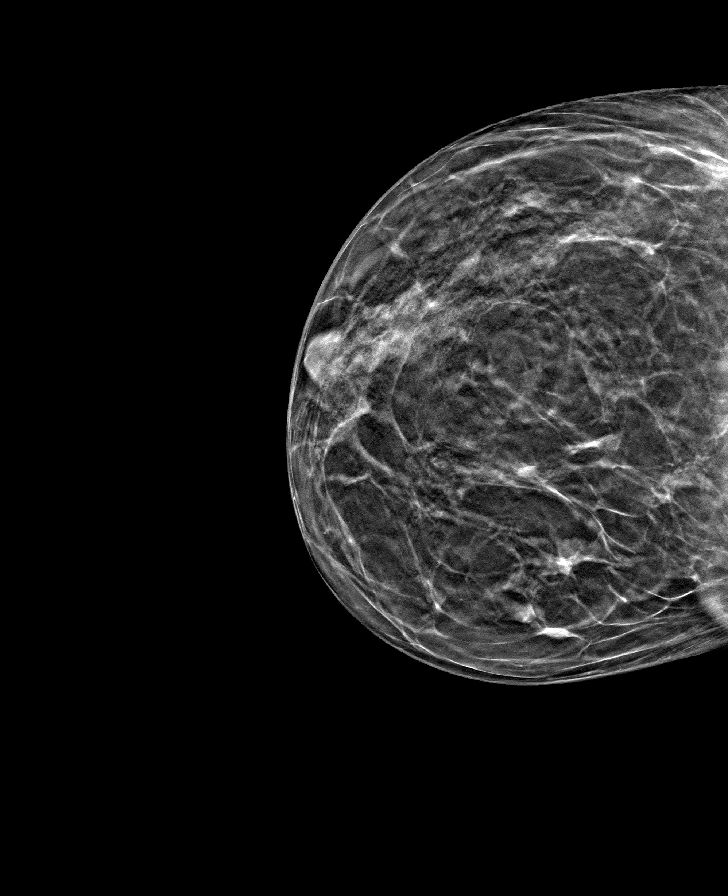

[L MLO tomo · tomo slice 27/54.0]
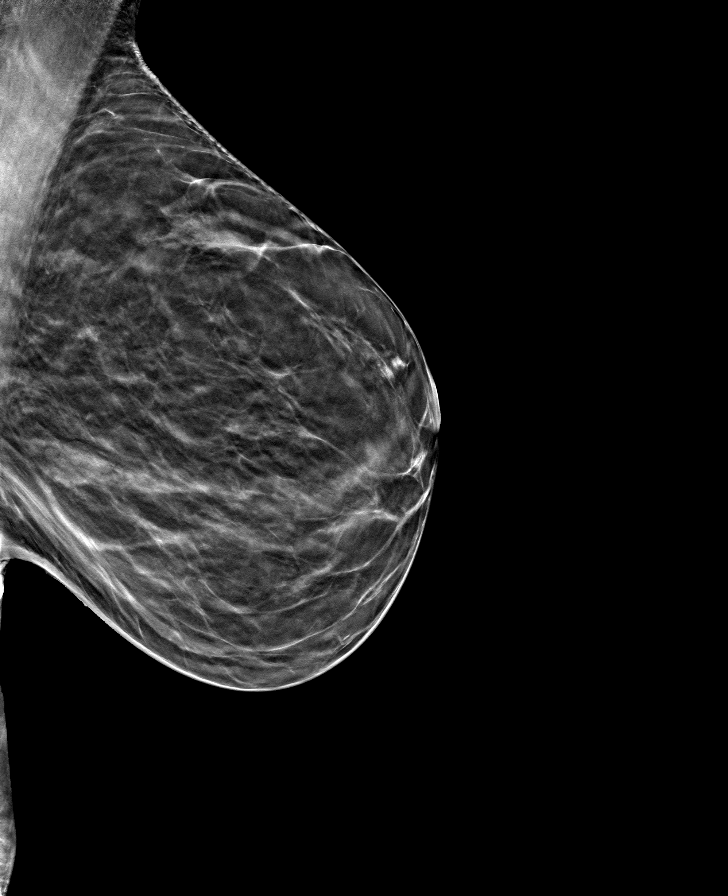

[R MLO tomo · tomo slice 27/53.0]
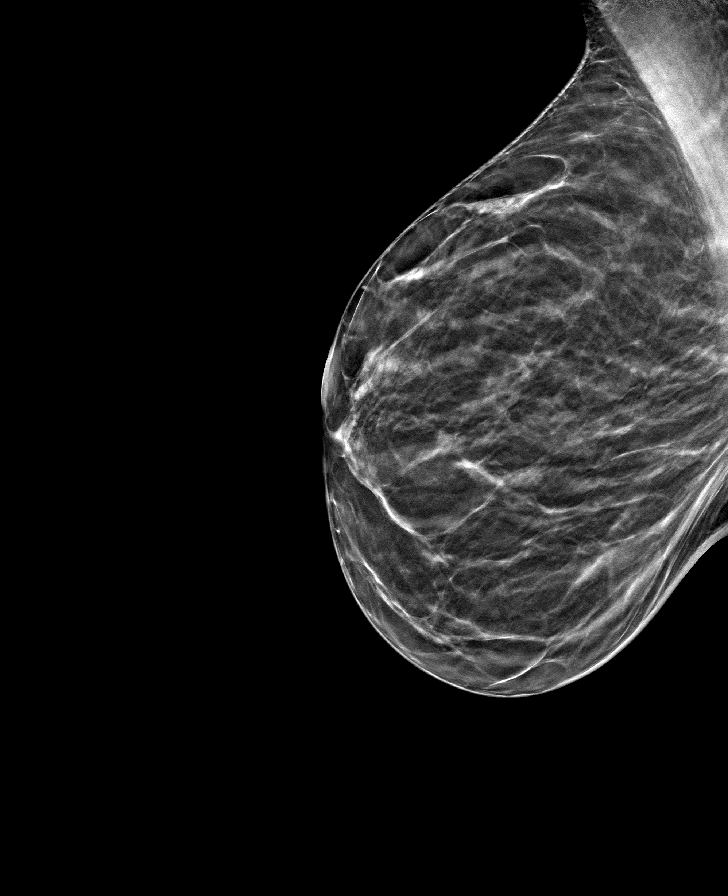

[8 of 24 positions shown; findings below may reference images not displayed]

ACR Breast Density Category b: There are scattered areas of
fibroglandular density.
FINDINGS: There are no findings suspicious for malignancy. Images were
processed with CAD.
IMPRESSION: No mammographic evidence of malignancy. A result letter of this
screening mammogram will be mailed directly to the patient.

RECOMMENDATION:
Screening mammogram in one year. (Code:[id number])

BI-RADS CATEGORY  1: Negative.

## 2020-12-19 ENCOUNTER — Other Ambulatory Visit: Payer: Self-pay | Admitting: Family Medicine

## 2020-12-19 DIAGNOSIS — M85859 Other specified disorders of bone density and structure, unspecified thigh: Secondary | ICD-10-CM

## 2021-01-21 NOTE — Progress Notes (Addendum)
[facility] [address], [place], [place] [address] [phone]  Date:  01/26/2021   Name:  Marilyn Hess   DOB:  1945/12/06   MRN:  [id number]  PCP:  Darreld Mclean, MD    Chief Complaint: Annual Exam   History of Present Illness:  Marilyn Hess is a 75 y.o. very pleasant female patient who presents with the following:  Here today at a CPE Last seen by myself 10/20200 history of hyperlipidemia, frequent PVCs, rosacea, osteopenia with increased hip fracture risk on antiresorptive therapy   Seen by cardiology about a year ago, seeing them again soon  Palpitations, bigeminy/trigeminy, high PVC burden: in ventricular trigeminy today  -Monitor shoes PVC burden of 18%, with occasional bigeminy and trigeminy. One episode of SVT as well.  -Echo without clear structural abnormality. -nuclear treadmill without evidence of ischemia -have discussed both beta blockers and calcium channels in the past. She would like to continue without medications as long as possible -only notices with significant activity, not limiting -she will call if this worsens and we will reassess  Intermittent elevated blood pressure readings: improved today from prior -continue to monitor, has not required medication Murmur:  -MR/TR on echo, not severe  mammo- will order for her at the med center  Colon cancer screening- she may be due for screening she will call an check with her GI doctor  Recommend 4th dose of covid to her today - she plans to do this  shingrix - not quite 5 years since zostavax yet  Update labs today  dexa 07/2019  Wt Readings from Last 3 Encounters:  01/26/21 151 lb (68.5 kg)  12/22/19 146 lb 6.4 oz (66.4 kg)  07/23/19 143 lb (64.9 kg)    Patient Active Problem List   Diagnosis Date Noted  . Prediabetes 07/24/2019  . Vitamin D deficiency 07/24/2019  . 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 Murmur, cardiac  07/23/2018  . PVC (premature ventricular contraction) 07/23/2018  . Hyperlipidemia 02/07/2015  . Overweight (BMI 25.0-29.9) 08/05/2014  . Osteopenia 07/10/2012  . ROSACEA 12/22/2010  . CARPAL TUNNEL SYNDROME 10/20/2007  . 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 LACTOSE INTOLERANCE 01/16/2007  . BREAST MASS, BENIGN 01/16/2007    Past Medical History:  Diagnosis Date  . Breast lump   . Hyperlipidemia     Past Surgical History:  Procedure Laterality Date  . CARPAL TUNNEL RELEASE  11-2014  . TOOTH EXTRACTION  07-2011    Social History   Tobacco Use  . Smoking status: Never Smoker  . Smokeless tobacco: Never Used  Substance Use Topics  . Alcohol use: No    Alcohol/week: 0.0 standard drinks  . Drug use: No    Family History  Adopted: Yes  Problem Relation Age of Onset  . Stroke Father   . Alcohol abuse Father   . Hypertension Father   . Alzheimer's disease Mother   . Hypertension Brother   . Colon cancer Neg Hx     Allergies  Allergen Reactions  . Cinnamon     Headache, breathing problem to long exposure    Medication list has been reviewed and updated.  Current Outpatient Medications on File Prior to Visit  Medication Sig Dispense Refill  . alendronate (FOSAMAX) 70 MG tablet TAKE 1 TABLET BY MOUTH ONCE A WEEK WITH  A  FULL  GLASS  OF  WATER  ON  AN  EMPTY  STOMACH 12 tablet 3  .  calcium carbonate (OS-CAL - DOSED IN MG OF ELEMENTAL CALCIUM) 1250 (500 CA) MG tablet Take 1 tablet by mouth. Reported on 10/05/2015    . calcium-vitamin D (OSCAL WITH D) 500-200 MG-UNIT tablet Take 1 tablet by mouth.    . Cholecalciferol (VITAMIN D3) 1.25 MG (50000 UT) CAPS Take 1 weekly for 12 weeks 12 capsule 0   No current facility-administered medications on file prior to visit.    Review of Systems:  As per HPI- otherwise negative.   Physical Examination: Vitals:   01/26/21 0903  BP: 128/82  Pulse: 78  Resp: 16  Temp: 97.8 F (36.6 C)  SpO2: 97%   Vitals:   01/26/21 0903   Weight: 151 lb (68.5 kg)  Height: 5\' 2"  (1.575 m)   Body mass index is 27.62 kg/m. Ideal Body Weight: Weight in (lb) to have BMI = 25: 136.4  GEN: no acute distress.  Mild overweight, looks well  HEENT: Atraumatic, Normocephalic.  Ears and Nose: No external deformity. CV: RRR-PVCs are apparent, No M/G/R. No JVD. No thrill. No extra heart sounds. PULM: CTA B, no wheezes, crackles, rhonchi. No retractions. No resp. distress. No accessory muscle use. ABD: S, NT, ND, +BS. No rebound. No HSM. EXTR: No c/c/e PSYCH: Normally interactive. Conversant.    Assessment and Plan: Physical exam  Dyslipidemia - Plan: Lipid panel  Vitamin D deficiency - Plan: VITAMIN D 25 Hydroxy (Vit-D Deficiency, Fractures)  Screening for deficiency anemia - Plan: CBC  Screening for diabetes mellitus - Plan: Comprehensive metabolic panel, Hemoglobin A1c  Osteopenia, unspecified location  Encounter for screening mammogram for breast cancer - Plan: MM 3D SCREEN BREAST BILATERAL  Pt here today for a follow-up/ CPE Encouraged healthy diet and exercise routine Ordered mammo Follow-up on her lipids and DM today with labs- Will plan further follow- up pending labs. Encourage 4th dose covid 19  This visit occurred during the SARS-CoV-2 public health emergency.  Safety protocols were in place, including screening questions prior to the visit, additional usage of staff PPE, and extensive cleaning of exam room while observing appropriate contact time as indicated for disinfecting solutions.    Signed Lamar Blinks, MD  Received her labs as below, message to patient  Results for orders placed or performed in visit on 01/26/21  CBC  Result Value Ref Range   WBC 4.5 4.0 - 10.5 K/uL   RBC 4.35 3.87 - 5.11 Mil/uL   Platelets 262.0 150.0 - 400.0 K/uL   Hemoglobin 13.5 12.0 - 15.0 g/dL   HCT 40.0 36.0 - 46.0 %   MCV 92.0 78.0 - 100.0 fl   MCHC 33.7 30.0 - 36.0 g/dL   RDW 12.2 11.5 - 15.5 %  Comprehensive  metabolic panel  Result Value Ref Range   Sodium 140 135 - 145 mEq/L   Potassium 4.1 3.5 - 5.1 mEq/L   Chloride 105 96 - 112 mEq/L   CO2 30 19 - 32 mEq/L   Glucose, Bld 85 70 - 99 mg/dL   BUN 23 6 - 23 mg/dL   Creatinine, Ser 0.82 0.40 - 1.20 mg/dL   Total Bilirubin 0.6 0.2 - 1.2 mg/dL   Alkaline Phosphatase 50 39 - 117 U/L   AST 20 0 - 37 U/L   ALT 15 0 - 35 U/L   Total Protein 6.2 6.0 - 8.3 g/dL   Albumin 4.1 3.5 - 5.2 g/dL   GFR 71.40 >60.00 mL/min   Calcium 9.0 8.4 - 10.5 mg/dL  Hemoglobin A1c  Result Value Ref Range   Hgb A1c MFr Bld 5.8 4.6 - 6.5 %  Lipid panel  Result Value Ref Range   Cholesterol 247 (H) 0 - 200 mg/dL   Triglycerides 53.0 0.0 - 149.0 mg/dL   HDL 86.40 >39.00 mg/dL   VLDL 10.6 0.0 - 40.0 mg/dL   LDL Cholesterol 150 (H) 0 - 99 mg/dL   Total CHOL/HDL Ratio 3    NonHDL 160.92   VITAMIN D 25 Hydroxy (Vit-D Deficiency, Fractures)  Result Value Ref Range   VITD 16.37 (L) 30.00 - 100.00 ng/mL

## 2021-01-25 ENCOUNTER — Other Ambulatory Visit: Payer: Self-pay

## 2021-01-26 ENCOUNTER — Ambulatory Visit (INDEPENDENT_AMBULATORY_CARE_PROVIDER_SITE_OTHER): Payer: Medicare PPO | Admitting: Family Medicine

## 2021-01-26 ENCOUNTER — Ambulatory Visit: Payer: Medicare PPO | Attending: Internal Medicine

## 2021-01-26 ENCOUNTER — Encounter (HOSPITAL_BASED_OUTPATIENT_CLINIC_OR_DEPARTMENT_OTHER): Payer: Self-pay

## 2021-01-26 ENCOUNTER — Ambulatory Visit (HOSPITAL_BASED_OUTPATIENT_CLINIC_OR_DEPARTMENT_OTHER)
Admission: RE | Admit: 2021-01-26 | Discharge: 2021-01-26 | Disposition: A | Discharge status: Home / Self Care | Payer: Medicare PPO | Source: Ambulatory Visit | Attending: Family Medicine | Admitting: Family Medicine

## 2021-01-26 ENCOUNTER — Encounter: Payer: Self-pay | Admitting: Family Medicine

## 2021-01-26 VITALS — BP 128/82 | HR 78 | Temp 97.8°F | Resp 16 | Ht 62.0 in | Wt 151.0 lb

## 2021-01-26 DIAGNOSIS — Z130 Encounter for screening for diseases of the blood and blood-forming organs and certain disorders involving the immune mechanism: Secondary | ICD-10-CM | POA: Diagnosis not present

## 2021-01-26 DIAGNOSIS — Z1231 Encounter for screening mammogram for malignant neoplasm of breast: Secondary | ICD-10-CM | POA: Insufficient documentation

## 2021-01-26 DIAGNOSIS — Z23 Encounter for immunization: Secondary | ICD-10-CM

## 2021-01-26 DIAGNOSIS — E785 Hyperlipidemia, unspecified: Secondary | ICD-10-CM

## 2021-01-26 DIAGNOSIS — Z0000 Encounter for general adult medical examination without abnormal findings: Secondary | ICD-10-CM | POA: Diagnosis not present

## 2021-01-26 DIAGNOSIS — Z131 Encounter for screening for diabetes mellitus: Secondary | ICD-10-CM | POA: Diagnosis not present

## 2021-01-26 DIAGNOSIS — M8580 Other specified disorders of bone density and structure, unspecified site: Secondary | ICD-10-CM | POA: Diagnosis not present

## 2021-01-26 DIAGNOSIS — E559 Vitamin D deficiency, unspecified: Secondary | ICD-10-CM | POA: Diagnosis not present

## 2021-01-26 LAB — LIPID PANEL
Cholesterol: 247 mg/dL — ABNORMAL HIGH (ref 0–200)
HDL: 86.4 mg/dL (ref >39.00)
LDL Cholesterol: 150 mg/dL — ABNORMAL HIGH (ref 0–99)
NonHDL: 160.92
Total CHOL/HDL Ratio: 3
Triglycerides: 53 mg/dL (ref 0.0–149.0)
VLDL: 10.6 mg/dL (ref 0.0–40.0)

## 2021-01-26 LAB — COMPREHENSIVE METABOLIC PANEL
ALT: 15 U/L (ref 0–35)
AST: 20 U/L (ref 0–37)
Albumin: 4.1 g/dL (ref 3.5–5.2)
Alkaline Phosphatase: 50 U/L (ref 39–117)
BUN: 23 mg/dL (ref 6–23)
CO2: 30 mEq/L (ref 19–32)
Calcium: 9 mg/dL (ref 8.4–10.5)
Chloride: 105 mEq/L (ref 96–112)
Creatinine, Ser: 0.82 mg/dL (ref 0.40–1.20)
GFR: 71.4 mL/min (ref >60.00)
Glucose, Bld: 85 mg/dL (ref 70–99)
Potassium: 4.1 mEq/L (ref 3.5–5.1)
Sodium: 140 mEq/L (ref 135–145)
Total Bilirubin: 0.6 mg/dL (ref 0.2–1.2)
Total Protein: 6.2 g/dL (ref 6.0–8.3)

## 2021-01-26 LAB — CBC
HCT: 40 % (ref 36.0–46.0)
Hemoglobin: 13.5 g/dL (ref 12.0–15.0)
MCHC: 33.7 g/dL (ref 30.0–36.0)
MCV: 92 fl (ref 78.0–100.0)
Platelets: 262 10*3/uL (ref 150.0–400.0)
RBC: 4.35 Mil/uL (ref 3.87–5.11)
RDW: 12.2 % (ref 11.5–15.5)
WBC: 4.5 10*3/uL (ref 4.0–10.5)

## 2021-01-26 LAB — VITAMIN D 25 HYDROXY (VIT D DEFICIENCY, FRACTURES): VITD: 16.37 ng/mL — ABNORMAL LOW (ref 30.00–100.00)

## 2021-01-26 LAB — HEMOGLOBIN A1C: Hgb A1c MFr Bld: 5.8 % (ref 4.6–6.5)

## 2021-01-26 MED ORDER — VITAMIN D3 1.25 MG (50000 UT) PO CAPS: ORAL_CAPSULE | ORAL | 0 refills | Status: DC

## 2021-01-26 NOTE — Addendum Note (Signed)
Addended by: Lamar Blinks C on: 01/26/2021 09:12 PM   Modules accepted: Orders

## 2021-01-26 NOTE — Patient Instructions (Addendum)
It was great to see you today- I will be in touch with your labs asap Do work on getting back into a walking routine Mammogram is ordered Bone density can be updated this fall I do recommend a 4th dose of covid 19 vaccine    Health Maintenance After Age 73 After age 50, you are at a higher risk for certain long-term diseases and infections as well as injuries from falls. Falls are a major cause of broken bones and head injuries in people who are older than age 9. Getting regular preventive care can help to keep you healthy and well. Preventive care includes getting regular testing and making lifestyle changes as recommended by your health care provider. Talk with your health care provider about:  Which screenings and tests you should have. A screening is a test that checks for a disease when you have no symptoms.  A diet and exercise plan that is right for you. What should I know about screenings and tests to prevent falls? Screening and testing are the best ways to find a health problem early. Early diagnosis and treatment give you the best chance of managing medical conditions that are common after age 47. Certain conditions and lifestyle choices may make you more likely to have a fall. Your health care provider may recommend:  Regular vision checks. Poor vision and conditions such as cataracts can make you more likely to have a fall. If you wear glasses, make sure to get your prescription updated if your vision changes.  Medicine review. Work with your health care provider to regularly review all of the medicines you are taking, including over-the-counter medicines. Ask your health care provider about any side effects that may make you more likely to have a fall. Tell your health care provider if any medicines that you take make you feel dizzy or sleepy.  Osteoporosis screening. Osteoporosis is a condition that causes the bones to get weaker. This can make the bones weak and cause them to  break more easily.  Blood pressure screening. Blood pressure changes and medicines to control blood pressure can make you feel dizzy.  Strength and balance checks. Your health care provider may recommend certain tests to check your strength and balance while standing, walking, or changing positions.  Foot health exam. Foot pain and numbness, as well as not wearing proper footwear, can make you more likely to have a fall.  Depression screening. You may be more likely to have a fall if you have a fear of falling, feel emotionally low, or feel unable to do activities that you used to do.  Alcohol use screening. Using too much alcohol can affect your balance and may make you more likely to have a fall. What actions can I take to lower my risk of falls? General instructions  Talk with your health care provider about your risks for falling. Tell your health care provider if: ? You fall. Be sure to tell your health care provider about all falls, even ones that seem minor. ? You feel dizzy, sleepy, or off-balance.  Take over-the-counter and prescription medicines only as told by your health care provider. These include any supplements.  Eat a healthy diet and maintain a healthy weight. A healthy diet includes low-fat dairy products, low-fat (lean) meats, and fiber from whole grains, beans, and lots of fruits and vegetables. Home safety  Remove any tripping hazards, such as rugs, cords, and clutter.  Install safety equipment such as grab bars in bathrooms and  safety rails on stairs.  Keep rooms and walkways well-lit. Activity  Follow a regular exercise program to stay fit. This will help you maintain your balance. Ask your health care provider what types of exercise are appropriate for you.  If you need a cane or walker, use it as recommended by your health care provider.  Wear supportive shoes that have nonskid soles.   Lifestyle  Do not drink alcohol if your health care provider tells  you not to drink.  If you drink alcohol, limit how much you have: ? 0-1 drink a day for women. ? 0-2 drinks a day for men.  Be aware of how much alcohol is in your drink. In the [place] one drink equals one typical bottle of beer (12 oz), one-half glass of wine (5 oz), or one shot of hard liquor (1 oz).  Do not use any products that contain nicotine or tobacco, such as cigarettes and e-cigarettes. If you need help quitting, ask your health care provider. Summary  Having a healthy lifestyle and getting preventive care can help to protect your health and wellness after age 73.  Screening and testing are the best way to find a health problem early and help you avoid having a fall. Early diagnosis and treatment give you the best chance for managing medical conditions that are more common for people who are older than age 35.  Falls are a major cause of broken bones and head injuries in people who are older than age 77. Take precautions to prevent a fall at home.  Work with your health care provider to learn what changes you can make to improve your health and wellness and to prevent falls. This information is not intended to replace advice given to you by your health care provider. Make sure you discuss any questions you have with your health care provider. Document Revised: 01/29/2019 Document Reviewed: 08/21/2017 Elsevier Patient Education  2021 [employer].

## 2021-01-26 NOTE — Progress Notes (Signed)
   Covid-19 Vaccination Clinic  Name:  Marilyn Hess    MRN: [id number] DOB: [birth year]  01/26/2021  [name] was observed post Covid-19 immunization for 15 minutes without incident. She was provided with Vaccine Information Sheet and instruction to access the V-Safe system.   [name] was instructed to call 911 with any severe reactions post vaccine: [name] Kitchen Difficulty breathing  . Swelling of face and throat  . A fast heartbeat  . A bad rash all over body  . Dizziness and weakness   Immunizations Administered    Name Date Dose VIS Date Route   PFIZER Comrnaty(Gray TOP) Covid-19 Vaccine 01/26/2021 10:02 AM 0.3 mL 09/29/2020 Intramuscular   Manufacturer: [place]   Lot: [id number]   [place]: [phone]

## 2021-01-27 ENCOUNTER — Encounter: Payer: Self-pay | Admitting: Internal Medicine

## 2021-02-02 ENCOUNTER — Other Ambulatory Visit (HOSPITAL_BASED_OUTPATIENT_CLINIC_OR_DEPARTMENT_OTHER): Payer: Self-pay

## 2021-02-02 MED ORDER — PFIZER-BIONT COVID-19 VAC-TRIS 30 MCG/0.3ML IM SUSP
INTRAMUSCULAR | 0 refills | Status: DC
  Filled 2021-02-02: qty 0.3, 1d supply, fill #0

## 2021-02-03 ENCOUNTER — Other Ambulatory Visit (HOSPITAL_BASED_OUTPATIENT_CLINIC_OR_DEPARTMENT_OTHER): Payer: Self-pay

## 2021-02-15 ENCOUNTER — Ambulatory Visit: Payer: Medicare PPO | Admitting: Cardiology

## 2021-03-08 NOTE — Progress Notes (Signed)
Cardiology Office Note   Date:  03/10/2021   ID:  Marilyn Hess, DOB 06-Mar-1946, MRN [id number]  PCP:  Darreld Mclean, MD  Cardiologist: Dr.Christopher  CC: Follow Up   History of Present Illness: Marilyn Hess is a 75 y.o. female who presents for ongoing assessment and management of bradycardia, palpitation, and reported heart murmur.  She did have 1 episode of near syncope after sitting on the toilet for too long and felt very lightheaded when she got up.  She cut back on caffeine and had been drinking greater than a quart of coffee in the morning.    When seen by Dr. Harrell Gave on 12/22/2019 her EKG revealed ventricular trigeminy.  She did not feel limited by her PVCs and only noticed them when she was overstressed or overly tired.  She did not complain of dyspnea on exertion was able to walk several miles.  She was asymptomatic for chest pain and fatigue.  There were no changes made to her medication regimen or new testing planned.  She comes today doing very well without cardiac complaints.  She does have some concern about varicose veins and soreness in her feet bilaterally which have limited her walking is much as she wishes to.  She does have some neuropathy discomfort with burning in her feet and legs.  Past Medical History:  Diagnosis Date  . Breast lump   . Hyperlipidemia     Past Surgical History:  Procedure Laterality Date  . CARPAL TUNNEL RELEASE  11-2014  . TOOTH EXTRACTION  07-2011     Current Outpatient Medications  Medication Sig Dispense Refill  . alendronate (FOSAMAX) 70 MG tablet TAKE 1 TABLET BY MOUTH ONCE A WEEK WITH  A  FULL  GLASS  OF  WATER  ON  AN  EMPTY  STOMACH 12 tablet 3  . calcium-vitamin D (OSCAL WITH D) 500-200 MG-UNIT tablet Take 1 tablet by mouth.    . Cholecalciferol (VITAMIN D3) 1.25 MG (50000 UT) CAPS Take 1 weekly for 12 weeks 12 capsule 0  . COVID-19 mRNA Vac-TriS, Pfizer, (PFIZER-BIONT COVID-19 VAC-TRIS) SUSP injection Inject into  the muscle. 0.3 mL 0   No current facility-administered medications for this visit.    Allergies:   Cinnamon    Social History:  The patient  reports that she has never smoked. She has never used smokeless tobacco. She reports that she does not drink alcohol and does not use drugs.   Family History:  [name] history includes Alcohol abuse in her father; Alzheimer's disease in her mother; Hypertension in her brother and father; Stroke in her father. She was adopted.    ROS: All other systems are reviewed and negative. Unless otherwise mentioned in H&P    PHYSICAL EXAM: VS:  BP 138/84   Pulse 68   Ht 5\' 2"  (1.575 m)   Wt 152 lb 12.8 oz (69.3 kg)   SpO2 96%   BMI 27.95 kg/m  , BMI Body mass index is 27.95 kg/m. GEN: Well nourished, well developed, in no acute distress HEENT: normal Neck: no JVD, carotid bruits, or masses Cardiac:RRR; 1/6 soft systolic murmurs, heard best at the right sternal border, occasional extrasystoles, rubs, or gallops,no edema varicosities noted bilaterally below the knee.  Pulses are strong. Respiratory:  Clear to auscultation bilaterally, normal work of breathing GI: soft, nontender, nondistended, + BS MS: no deformity or atrophy Skin: warm and dry, no rash Neuro:  Strength and sensation are intact Psych: euthymic mood, full affect  EKG: Normal sinus rhythm heart rate of 68 bpm (personally reviewed)  Recent Labs: 01/26/2021: ALT 15; BUN 23; Creatinine, Ser 0.82; Hemoglobin 13.5; Platelets 262.0; Potassium 4.1; Sodium 140    Lipid Panel    Component Value Date/Time   CHOL 247 (H) 01/26/2021 0923   TRIG 53.0 01/26/2021 0923   TRIG 54 08/26/2006 0925   HDL 86.40 01/26/2021 0923   CHOLHDL 3 01/26/2021 0923   VLDL 10.6 01/26/2021 0923   LDLCALC 150 (H) 01/26/2021 0923   LDLDIRECT 118.4 07/10/2012 0849      Wt Readings from Last 3 Encounters:  03/10/21 152 lb 12.8 oz (69.3 kg)  01/26/21 151 lb (68.5 kg)  12/22/19 146 lb 6.4 oz  (66.4 kg)      Other studies Reviewed: Treadmill nuclear 10/28/2018  Patient walked for 6:00 of a Bruce protocol GXt. Peak HR was 133 which is 88% .  At peak exercise, there were no ST or T wave changes to suggest ischemia.  Defect 1: There is a small defect of mild severity present in the apex location. This is likely due to artifact.  There is no evidence of ischemia. There is apical thinning but this is likely artifact. LV function information is not abailable due to inability to gate the study .  This is a low risk study. LV EF information is not available. Suggest echocardiogram if not already performed  Echo 07/28/18 Study Conclusions  - Left ventricle: The cavity size was normal. Systolic function was normal. The estimated ejection fraction was in the range of 55% to 60%. Wall motion was normal; there were no regional wall motion abnormalities. Left ventricular diastolic function parameters were normal. - Aortic valve: Valve area (VTI): 1.63 cm^2. Valve area (Vmax): 1.48 cm^2. Valve area (Vmean): 1.61 cm^2. - Left atrium: The atrium was moderately to severely dilated. - Right atrium: The atrium was moderately dilated. - Tricuspid valve: Peak RV-RA gradient (S): 18 mm Hg. - Pulmonary arteries: PA peak pressure: 33 mm Hg (S). Impressions:  - Normal left and right ventricular chamber size and function, with LVEF 55-60%. No hemodynamically significant valve disease. Biatrial enlargement. RVSP approximately 27-32 mmHg.  Monitor 08/07/18 4 days, 1 hour of interpretable data on [facility]. No patient triggered events, and one auto recorded event of SVT. No sustained VT, pauses, or high degree AV block noted.  Patient had a min HR of 45 bpm, max HR of 141 bpm, and avg HR of 74 bpm. Predominant underlying rhythm was Sinus Rhythm. Intermittent Bundle Branch Block was present.   1 run of Supraventricular Tachycardia occurred lasting 6 beats with a max rate of 141  bpm (avg 134 bpm). Isolated SVEs were rare (<1.0%), SVE Couplets were rare (<1.0%), and SVE Triplets were rare (<1.0%). Isolated VEs were frequent (18.0%, [id number]), VE Couplets were rare (<1.0%, 452), and VE Triplets were rare (<1.0%, 7). Ventricular Bigeminy and Trigeminy were present.  ASSESSMENT AND PLAN:  1.  Frequent PVCs: Asymptomatic although she does notice them at nighttime when she is very tired.  She is caffeine free.  Remains active.  No changes in her medication regimen or testing planned.  2.  Lower extremity varicosities: I have advised her to wear support hose and given her a flyer to [place], so that she may be fitted for these support hose.  She had worn some in the past but they were too tight and she chose not to wear them anymore.  Hopefully getting her legs measured appropriately will  be helpful.  3.  Hyperlipidemia: Continue low-cholesterol Mediterranean diet, avoiding fried foods.   Current medicines are reviewed at length with the patient today.  I have spent 25 minutes dedicated to the care of this patient on the date of this encounter to include pre-visit review of records, assessment, management and diagnostic testing,with shared decision making.  Labs/ tests ordered today include: None Phill Myron. West Pugh, ANP, AACC   03/10/2021 9:18 AM    [address] Office [phone] Fax [phone]  Notice: This dictation was prepared with Dragon dictation along with smaller phrase technology. Any transcriptional errors that result from this process are unintentional and may not be corrected upon review.

## 2021-03-10 ENCOUNTER — Other Ambulatory Visit: Payer: Self-pay

## 2021-03-10 ENCOUNTER — Ambulatory Visit: Payer: Medicare PPO | Admitting: Adult Health

## 2021-03-10 ENCOUNTER — Encounter: Payer: Self-pay | Admitting: Adult Health

## 2021-03-10 VITALS — BP 138/84 | HR 68 | Ht 62.0 in | Wt 152.8 lb

## 2021-03-10 DIAGNOSIS — R002 Palpitations: Secondary | ICD-10-CM

## 2021-03-10 DIAGNOSIS — E7800 Pure hypercholesterolemia, unspecified: Secondary | ICD-10-CM

## 2021-03-10 DIAGNOSIS — I83893 Varicose veins of bilateral lower extremities with other complications: Secondary | ICD-10-CM

## 2021-03-10 NOTE — Patient Instructions (Signed)
Medication Instructions:  Your physician recommends that you continue on your current medications as directed. Please refer to the Current Medication list given to you today.  *If you need a refill on your cardiac medications before your next appointment, please call your pharmacy*  Lab Work: NONE ordered at this time of appointment   If you have labs (blood work) drawn today and your tests are completely normal, you will receive your results only by: [name] Kitchen MyChart Message (if you have MyChart) OR . A paper copy in the mail If you have any lab test that is abnormal or we need to change your treatment, we will call you to review the results.  Testing/Procedures: NONE ordered at this time of appointment   Follow-Up: At [facility], you and your health needs are our priority.  As part of our continuing mission to provide you with exceptional heart care, we have created designated Provider Care Teams.  These Care Teams include your primary Cardiologist (physician) and Advanced Practice Providers (APPs -  Physician Assistants and Nurse Practitioners) who all work together to provide you with the care you need, when you need it.  Your next appointment:   6 month(s)  The format for your next appointment:   In Person  Provider:   Buford Dresser, MD  Other Instructions

## 2021-04-17 ENCOUNTER — Ambulatory Visit: Payer: Medicare PPO | Admitting: *Deleted

## 2021-04-19 NOTE — Progress Notes (Signed)
Subjective:   Marilyn Hess is a 75 y.o. female who presents for Medicare Annual (Subsequent) preventive examination.  Review of Systems     Cardiac Risk Factors include: advanced age (>15men, >9 women);sedentary lifestyle     Objective:    Today's Vitals   04/20/21 0851 04/20/21 0855  BP: 130/70   Pulse: 71   Resp: 16   Temp: 97.7 F (36.5 C)   TempSrc: Temporal   SpO2: 97%   Weight: 153 lb 6.4 oz (69.6 kg)   Height: 5\' 2"  (1.575 m)   PainSc:  0-No pain   Body mass index is 28.06 kg/m.  Advanced Directives 04/20/2021 04/14/2020 04/13/2019 04/11/2018 04/08/2017 09/21/2015  Does Patient Have a Medical Advance Directive? Yes No No No No No  Type of Paramedic of [place];Living will - - - - -  Does patient want to make changes to medical advance directive? - No - Patient declined - - - -  Copy of [place] in Chart? No - copy requested - - - - -  Would patient like information on creating a medical advance directive? - - No - Patient declined Yes (MAU/Ambulatory/Procedural Areas - Information given) Yes (MAU/Ambulatory/Procedural Areas - Information given) No - patient declined information    Current Medications (verified) Outpatient Encounter Medications as of 04/20/2021  Medication Sig   alendronate (FOSAMAX) 70 MG tablet TAKE 1 TABLET BY MOUTH ONCE A WEEK WITH  A  FULL  GLASS  OF  WATER  ON  AN  EMPTY  STOMACH   calcium-vitamin D (OSCAL WITH D) 500-200 MG-UNIT tablet Take 1 tablet by mouth.   Cholecalciferol (VITAMIN D3) 1.25 MG (50000 UT) CAPS Take 1 weekly for 12 weeks   [DISCONTINUED] COVID-19 mRNA Vac-TriS, Pfizer, (PFIZER-BIONT COVID-19 VAC-TRIS) SUSP injection Inject into the muscle.   No facility-administered encounter medications on file as of 04/20/2021.    Allergies (verified) Cinnamon   History: Past Medical History:  Diagnosis Date   Breast lump    Hyperlipidemia    Past Surgical History:  Procedure  Laterality Date   CARPAL TUNNEL RELEASE  11-2014   TOOTH EXTRACTION  07-2011   Family History  Adopted: Yes  Problem Relation Age of Onset   Stroke Father    Alcohol abuse Father    Hypertension Father    Alzheimer's disease Mother    Hypertension Brother    Colon cancer Neg Hx    Social History   Socioeconomic History   Marital status: Divorced    Spouse name: Not on file   Number of children: Not on file   Years of education: Not on file   Highest education level: Not on file  Occupational History   Not on file  Tobacco Use   Smoking status: Never   Smokeless tobacco: Never  Substance and Sexual Activity   Alcohol use: No    Alcohol/week: 0.0 standard drinks   Drug use: No   Sexual activity: Never  Other Topics Concern   Not on file  Social History Narrative   Not on file   Social Determinants of Health   Financial Resource Strain: Low Risk    Difficulty of Paying Living Expenses: Not hard at all  Food Insecurity: No Food Insecurity   Worried About Charity fundraiser in the Last Year: Never true   [place] in the Last Year: Never true  Transportation Needs: No Transportation Needs   Lack of Transportation (Medical):  No   Lack of Transportation (Non-Medical): No  Physical Activity: Inactive   Days of Exercise per Week: 0 days   Minutes of Exercise per Session: 0 min  Stress: No Stress Concern Present   Feeling of Stress : Not at all  Social Connections: Moderately Isolated   Frequency of Communication with Friends and Family: More than three times a week   Frequency of Social Gatherings with Friends and Family: More than three times a week   Attends Religious Services: More than 4 times per year   Active Member of [employer] or Organizations: No   Attends Music therapist: Never   Marital Status: Divorced    Tobacco Counseling Counseling given: Not Answered   Clinical Intake:  Pre-visit preparation completed: Yes  Pain : 0-10 Pain  Score: 0-No pain (pain comes & goes) Pain Type: Chronic pain Pain Location: Foot Pain Orientation: Right Pain Onset: More than a month ago Pain Frequency: Intermittent     Nutritional Status: BMI 25 -29 Overweight Nutritional Risks: None Diabetes: No  How often do you need to have someone help you when you read instructions, pamphlets, or other written materials from your doctor or pharmacy?: 1 - Never  Diabetic?No  Interpreter Needed?: No  Information entered by :: Caroleen Hamman LPN   Activities of Daily Living In your present state of health, do you have any difficulty performing the following activities: 04/20/2021  Hearing? N  Vision? N  Difficulty concentrating or making decisions? Y  Comment occasionally forgets names  Walking or climbing stairs? N  Dressing or bathing? N  Doing errands, shopping? N  Preparing Food and eating ? N  Using the Toilet? N  In the past six months, have you accidently leaked urine? N  Do you have problems with loss of bowel control? N  Managing your Medications? N  Managing your Finances? N  Housekeeping or managing your Housekeeping? N  Some recent data might be hidden    Patient Care Team: Copland, Gay Filler, MD as PCP - General (Family Medicine) Buford Dresser, MD as PCP - Cardiology (Cardiology) [place], Jennefer Bravo, MD as Referring Physician (Dermatology)  Indicate any recent Medical Services you may have received from other than [facility] providers in the past year (date may be approximate).     Assessment:   This is a routine wellness examination for Marilyn Hess.  Hearing/Vision screen Hearing Screening - Comments:: No issues Vision Screening - Comments:: Last eye exam-2021-[facility] Reading glasses sometimes  Dietary issues and exercise activities discussed: Current Exercise Habits: The patient does not participate in regular exercise at present, Exercise limited by: orthopedic condition(s) (foot pain)    Goals Addressed               This Visit's Progress     Patient Stated     Continue exercising (pt-stated)   Not on track      Depression Screen PHQ 2/9 Scores 04/20/2021 04/20/2021 04/14/2020 04/13/2019 04/11/2018 04/08/2017 08/08/2015  PHQ - 2 Score 1 0 0 0 0 0 0    Fall Risk Fall Risk  04/20/2021 04/14/2020 04/13/2019 04/11/2018 04/08/2017  Falls in the past year? 0 0 0 No No  Number falls in past yr: 0 0 - - -  Injury with Fall? 0 0 - - -  Follow up Falls prevention discussed Education provided;Falls prevention discussed - - -    FALL RISK PREVENTION PERTAINING TO THE HOME:  Any stairs in or around the home? No  Home free of loose throw rugs in walkways, pet beds, electrical cords, etc? No  pt aware of fall risk Adequate lighting in your home to reduce risk of falls? Yes   ASSISTIVE DEVICES UTILIZED TO PREVENT FALLS:  Life alert? No  Use of a cane, walker or w/c? No  Grab bars in the bathroom? Yes  Shower chair or bench in shower? Yes  Elevated toilet seat or a handicapped toilet? No   TIMED UP AND GO:  Was the test performed? Yes .  Length of time to ambulate 10 feet: 10 sec.   Gait steady and fast without use of assistive device  Cognitive Function:        Immunizations Immunization History  Administered Date(s) Administered   Fluad Quad(high Dose 65+) 07/23/2019   Influenza, High Dose Seasonal PF 07/08/2017, 07/21/2018   Influenza,inj,Quad PF,6+ Mos 08/05/2013, 08/08/2015   PFIZER Comirnaty(Gray Top)Covid-19 Tri-Sucrose Vaccine 01/26/2021   PFIZER(Purple Top)SARS-COV-2 Vaccination 11/30/2019, 12/18/2019, 08/06/2020   Pneumococcal Conjugate-13 08/05/2014   Pneumococcal Polysaccharide-23 08/08/2015   Tdap 06/07/2015   Zoster, Live 02/21/2016    TDAP status: Up to date  Flu Vaccine status: Up to date  Pneumococcal vaccine status: Up to date  Covid-19 vaccine status: Completed vaccines  Qualifies for Shingles Vaccine? Yes   Zostavax completed Yes    Shingrix Completed?: No.    Education has been provided regarding the importance of this vaccine. Patient has been advised to call insurance company to determine out of pocket expense if they have not yet received this vaccine. Advised may also receive vaccine at local pharmacy or Health Dept. Verbalized acceptance and understanding.  Screening Tests Health Maintenance  Topic Date Due   Zoster Vaccines- Shingrix (1 of 2) Never done   COLONOSCOPY (Pts 45-58yrs Insurance coverage will need to be confirmed)  10/04/2020   INFLUENZA VACCINE  05/22/2021   MAMMOGRAM  01/26/2022   TETANUS/TDAP  06/06/2025   DEXA SCAN  Completed   COVID-19 Vaccine  Completed   Hepatitis C Screening  Completed   PNA vac Low Risk Adult  Completed   HPV VACCINES  Aged Out    Health Maintenance  Health Maintenance Due  Topic Date Due   Zoster Vaccines- Shingrix (1 of 2) Never done   COLONOSCOPY (Pts 45-35yrs Insurance coverage will need to be confirmed)  10/04/2020    Colorectal cancer screening: Referral to GI placed today. Pt aware the office will call re: appt.  Mammogram status: Completed Bilateral 01/26/2021. Repeat every year  Bone Density status: Completed 07/28/2019. Results reflect: Bone density results: OSTEOPENIA. Repeat every 2 years.  Lung Cancer Screening: (Low Dose CT Chest recommended if Age 12-80 years, 30 pack-year currently smoking OR have quit w/in 15years.) does not qualify.     Additional Screening:  Hepatitis C Screening: Completed 07/08/2017   Vision Screening: Recommended annual ophthalmology exams for early detection of glaucoma and other disorders of the eye. Is the patient up to date with their annual eye exam?  Yes  Who is the provider or what is the name of the office in which the patient attends annual eye exams? [facility]   Dental Screening: Recommended annual dental exams for proper oral hygiene  Community Resource Referral / Chronic Care Management: CRR  required this visit?  No   CCM required this visit?  No      Plan:     I have personally reviewed and noted the following in the patient's chart:   Medical and social  history Use of alcohol, tobacco or illicit drugs  Current medications and supplements including opioid prescriptions.  Functional ability and status Nutritional status Physical activity Advanced directives List of other physicians Hospitalizations, surgeries, and ER visits in previous 12 months Vitals Screenings to include cognitive, depression, and falls Referrals and appointments  In addition, I have reviewed and discussed with patient certain preventive protocols, quality metrics, and best practice recommendations. A written personalized care plan for preventive services as well as general preventive health recommendations were provided to patient.   Patient to access avs online  Marta Antu, LPN   [id number]  Nurse Health Advisor  Nurse Notes: None

## 2021-04-20 ENCOUNTER — Ambulatory Visit (INDEPENDENT_AMBULATORY_CARE_PROVIDER_SITE_OTHER): Payer: Medicare PPO

## 2021-04-20 ENCOUNTER — Other Ambulatory Visit: Payer: Self-pay

## 2021-04-20 VITALS — BP 130/70 | HR 71 | Temp 97.7°F | Resp 16 | Ht 62.0 in | Wt 153.4 lb

## 2021-04-20 DIAGNOSIS — Z0000 Encounter for general adult medical examination without abnormal findings: Secondary | ICD-10-CM | POA: Diagnosis not present

## 2021-04-20 DIAGNOSIS — Z1211 Encounter for screening for malignant neoplasm of colon: Secondary | ICD-10-CM | POA: Diagnosis not present

## 2021-04-20 NOTE — Patient Instructions (Signed)
Marilyn Hess , Thank you for taking time to come for your Medicare Wellness Visit. I appreciate your ongoing commitment to your health goals. Please review the following plan we discussed and let me know if I can assist you in the future.   Screening recommendations/referrals: Colonoscopy: Referral placed today. Someone will be calling you to schedule. Mammogram: Completed 01/26/2021-Due 01/26/2022 Bone Density: Completed 07/28/2019-Due 07/27/2021 Recommended yearly ophthalmology/optometry visit for glaucoma screening and checkup Recommended yearly dental visit for hygiene and checkup  Vaccinations: Influenza vaccine: Up to date Pneumococcal vaccine: Up to date Tdap vaccine: Up to date-Due-06/06/2025 Shingles vaccine: Discuss with pharmacy   Covid-19:Up to date  Advanced directives: Please bring a copy for your chart  Conditions/risks identified: See problem list  Next appointment: Follow up in one year for your annual wellness visit    Preventive Care 65 Years and Older, Female Preventive care refers to lifestyle choices and visits with your health care provider that can promote health and wellness. What does preventive care include? A yearly physical exam. This is also called an annual well check. Dental exams once or twice a year. Routine eye exams. Ask your health care provider how often you should have your eyes checked. Personal lifestyle choices, including: Daily care of your teeth and gums. Regular physical activity. Eating a healthy diet. Avoiding tobacco and drug use. Limiting alcohol use. Practicing safe sex. Taking low-dose aspirin every day. Taking vitamin and mineral supplements as recommended by your health care provider. What happens during an annual well check? The services and screenings done by your health care provider during your annual well check will depend on your age, overall health, lifestyle risk factors, and family history of disease. Counseling  Your  health care provider may ask you questions about your: Alcohol use. Tobacco use. Drug use. Emotional well-being. Home and relationship well-being. Sexual activity. Eating habits. History of falls. Memory and ability to understand (cognition). Work and work Statistician. Reproductive health. Screening  You may have the following tests or measurements: Height, weight, and BMI. Blood pressure. Lipid and cholesterol levels. These may be checked every 5 years, or more frequently if you are over 73 years old. Skin check. Lung cancer screening. You may have this screening every year starting at age 46 if you have a 30-pack-year history of smoking and currently smoke or have quit within the past 15 years. Fecal occult blood test (FOBT) of the stool. You may have this test every year starting at age 63. Flexible sigmoidoscopy or colonoscopy. You may have a sigmoidoscopy every 5 years or a colonoscopy every 10 years starting at age 44. Hepatitis C blood test. Hepatitis B blood test. Sexually transmitted disease (STD) testing. Diabetes screening. This is done by checking your blood sugar (glucose) after you have not eaten for a while (fasting). You may have this done every 1-3 years. Bone density scan. This is done to screen for osteoporosis. You may have this done starting at age 58. Mammogram. This may be done every 1-2 years. Talk to your health care provider about how often you should have regular mammograms. Talk with your health care provider about your test results, treatment options, and if necessary, the need for more tests. Vaccines  Your health care provider may recommend certain vaccines, such as: Influenza vaccine. This is recommended every year. Tetanus, diphtheria, and acellular pertussis (Tdap, Td) vaccine. You may need a Td booster every 10 years. Zoster vaccine. You may need this after age 78. Pneumococcal 13-valent conjugate (PCV13)  vaccine. One dose is recommended after age  66. Pneumococcal polysaccharide (PPSV23) vaccine. One dose is recommended after age 89. Talk to your health care provider about which screenings and vaccines you need and how often you need them. This information is not intended to replace advice given to you by your health care provider. Make sure you discuss any questions you have with your health care provider. Document Released: 11/04/2015 Document Revised: 06/27/2016 Document Reviewed: 08/09/2015 Elsevier Interactive Patient Education  2017 [place] Prevention in the Home Falls can cause injuries. They can happen to people of all ages. There are many things you can do to make your home safe and to help prevent falls. What can I do on the outside of my home? Regularly fix the edges of walkways and driveways and fix any cracks. Remove anything that might make you trip as you walk through a door, such as a raised step or threshold. Trim any bushes or trees on the path to your home. Use bright outdoor lighting. Clear any walking paths of anything that might make someone trip, such as rocks or tools. Regularly check to see if handrails are loose or broken. Make sure that both sides of any steps have handrails. Any raised decks and porches should have guardrails on the edges. Have any leaves, snow, or ice cleared regularly. Use sand or salt on walking paths during winter. Clean up any spills in your garage right away. This includes oil or grease spills. What can I do in the bathroom? Use night lights. Install grab bars by the toilet and in the tub and shower. Do not use towel bars as grab bars. Use non-skid mats or decals in the tub or shower. If you need to sit down in the shower, use a plastic, non-slip stool. Keep the floor dry. Clean up any water that spills on the floor as soon as it happens. Remove soap buildup in the tub or shower regularly. Attach bath mats securely with double-sided non-slip rug tape. Do not have throw  rugs and other things on the floor that can make you trip. What can I do in the bedroom? Use night lights. Make sure that you have a light by your bed that is easy to reach. Do not use any sheets or blankets that are too big for your bed. They should not hang down onto the floor. Have a firm chair that has side arms. You can use this for support while you get dressed. Do not have throw rugs and other things on the floor that can make you trip. What can I do in the kitchen? Clean up any spills right away. Avoid walking on wet floors. Keep items that you use a lot in easy-to-reach places. If you need to reach something above you, use a strong step stool that has a grab bar. Keep electrical cords out of the way. Do not use floor polish or wax that makes floors slippery. If you must use wax, use non-skid floor wax. Do not have throw rugs and other things on the floor that can make you trip. What can I do with my stairs? Do not leave any items on the stairs. Make sure that there are handrails on both sides of the stairs and use them. Fix handrails that are broken or loose. Make sure that handrails are as long as the stairways. Check any carpeting to make sure that it is firmly attached to the stairs. Fix any carpet that is loose  or worn. Avoid having throw rugs at the top or bottom of the stairs. If you do have throw rugs, attach them to the floor with carpet tape. Make sure that you have a light switch at the top of the stairs and the bottom of the stairs. If you do not have them, ask someone to add them for you. What else can I do to help prevent falls? Wear shoes that: Do not have high heels. Have rubber bottoms. Are comfortable and fit you well. Are closed at the toe. Do not wear sandals. If you use a stepladder: Make sure that it is fully opened. Do not climb a closed stepladder. Make sure that both sides of the stepladder are locked into place. Ask someone to hold it for you, if  possible. Clearly mark and make sure that you can see: Any grab bars or handrails. First and last steps. Where the edge of each step is. Use tools that help you move around (mobility aids) if they are needed. These include: Canes. Walkers. Scooters. Crutches. Turn on the lights when you go into a dark area. Replace any light bulbs as soon as they burn out. Set up your furniture so you have a clear path. Avoid moving your furniture around. If any of your floors are uneven, fix them. If there are any pets around you, be aware of where they are. Review your medicines with your doctor. Some medicines can make you feel dizzy. This can increase your chance of falling. Ask your doctor what other things that you can do to help prevent falls. This information is not intended to replace advice given to you by your health care provider. Make sure you discuss any questions you have with your health care provider. Document Released: 08/04/2009 Document Revised: 03/15/2016 Document Reviewed: 11/12/2014 Elsevier Interactive Patient Education  2017 [employer].

## 2021-10-06 ENCOUNTER — Encounter: Payer: Self-pay | Admitting: Family Medicine

## 2021-10-06 DIAGNOSIS — E119 Type 2 diabetes mellitus without complications: Secondary | ICD-10-CM | POA: Diagnosis not present

## 2021-11-30 ENCOUNTER — Other Ambulatory Visit: Payer: Self-pay | Admitting: Family Medicine

## 2021-11-30 DIAGNOSIS — M85859 Other specified disorders of bone density and structure, unspecified thigh: Secondary | ICD-10-CM

## 2021-12-04 ENCOUNTER — Ambulatory Visit (HOSPITAL_BASED_OUTPATIENT_CLINIC_OR_DEPARTMENT_OTHER): Payer: Medicare PPO | Admitting: Cardiology

## 2022-01-01 ENCOUNTER — Encounter (HOSPITAL_BASED_OUTPATIENT_CLINIC_OR_DEPARTMENT_OTHER): Payer: Self-pay | Admitting: Cardiology

## 2022-01-01 ENCOUNTER — Ambulatory Visit (HOSPITAL_BASED_OUTPATIENT_CLINIC_OR_DEPARTMENT_OTHER): Payer: Medicare PPO | Admitting: Cardiology

## 2022-01-01 ENCOUNTER — Other Ambulatory Visit: Payer: Self-pay

## 2022-01-01 VITALS — BP 138/76 | HR 84 | Ht 61.0 in | Wt 153.8 lb

## 2022-01-01 DIAGNOSIS — I340 Nonrheumatic mitral (valve) insufficiency: Secondary | ICD-10-CM

## 2022-01-01 DIAGNOSIS — Z7189 Other specified counseling: Secondary | ICD-10-CM | POA: Diagnosis not present

## 2022-01-01 DIAGNOSIS — I493 Ventricular premature depolarization: Secondary | ICD-10-CM | POA: Diagnosis not present

## 2022-01-01 DIAGNOSIS — I361 Nonrheumatic tricuspid (valve) insufficiency: Secondary | ICD-10-CM | POA: Diagnosis not present

## 2022-01-01 DIAGNOSIS — R030 Elevated blood-pressure reading, without diagnosis of hypertension: Secondary | ICD-10-CM

## 2022-01-01 DIAGNOSIS — R011 Cardiac murmur, unspecified: Secondary | ICD-10-CM

## 2022-01-01 NOTE — Progress Notes (Signed)
Cardiology Office Note:    Date:  01/01/2022   ID:  Marilyn Hess, DOB July 28, 1946, MRN [id number]  PCP:  Darreld Mclean, MD  Cardiologist:  Buford Dresser, MD PhD  Referring MD: Darreld Mclean, MD   CC: follow up  History of Present Illness:    Marilyn Hess is a 76 y.o. female with a history of PVCs who is seen in follow up for the evaluation and management of bradycardia and palpitations.   Today: She is not doing the best. She had difficulty finding the building. She relates her high blood pressure and headache to trying to get to her appointment. Her blood pressure improved upon repeat.  She endorses pain in her bilateral feet that prevents her from being active. She dropped a can of soup on her R foot and broke a metatarsal in her L foot. She also has bilateral bunions. She stays busy by completing her crafts, decorating for others, and teaching paper craft classes.  For diet, she has switched to decaffeinated coffee and tea. She still enjoys chocolate, Nutella, and peanut butter. She reports a history of low blood sugar and PVCs. Since reducing her caffeine intake, her PVCs are less frequent but her low sugar episodes bother her. She would have episodes of low blood sugar performing activities like grocery shopping or volunteering at her church. Eating a small snack would make her feel better.  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 Her foster father was diabetic.   Denies chest pain, shortness of breath at rest or with normal exertion. No PND, orthopnea, LE edema or unexpected weight gain. No syncope.  Past Medical History:  Diagnosis Date   Breast lump    Hyperlipidemia     Past Surgical History:  Procedure Laterality Date   CARPAL TUNNEL RELEASE  11-2014   TOOTH EXTRACTION  07-2011    Current Medications: Current Outpatient Medications on File Prior to Visit  Medication Sig   alendronate (FOSAMAX) 70 MG tablet TAKE 1 TABLET BY MOUTH ONCE  A WEEK WITH  A  FULL  GLASS  OF  WATER  ON  AN  EMPTY  STOMACH   calcium-vitamin D (OSCAL WITH D) 500-200 MG-UNIT tablet Take 1 tablet by mouth.   Cholecalciferol (VITAMIN D3) 1.25 MG (50000 UT) CAPS Take 1 weekly for 12 weeks   No current facility-administered medications on file prior to visit.     Allergies:   Cinnamon   Social History   Tobacco Use   Smoking status: Never   Smokeless tobacco: Never  Substance Use Topics   Alcohol use: No    Alcohol/week: 0.0 standard drinks   Drug use: No    Family History: [name] history includes Alcohol abuse in her father; Alzheimer's disease in her mother; Hypertension in her brother and father; Stroke in her father. There is no history of Colon cancer. She was adopted. she is adopted but knows that her biological father and brother had strokes. They had poor lifestyle with smoking and alcohol.  ROS:   Please see the history of present illness.   (+) Headache (+) Bilateral foot pain (+) Palpitations (+) Low blood sugar Additional pertinent ROS negative except as documented.  EKGs/Labs/Other Studies Reviewed:    The following studies were reviewed today: Treadmill nuclear 10/28/2018 Patient walked for 6:00 of a Bruce protocol GXt. Peak HR was 133 which is 88% . At peak exercise, there were no ST or T wave changes to suggest ischemia.  Defect 1: There is a small defect of mild severity present in the apex location. This is likely due to artifact. There is no evidence of ischemia. There is apical thinning but this is likely artifact. LV function information is not abailable due to inability to gate the study . This is a low risk study. LV EF information is not available. Suggest echocardiogram if not already performed  Echo 07/28/18 Study Conclusions   - Left ventricle: The cavity size was normal. Systolic function was   normal. The estimated ejection fraction was in the range of 55%   to 60%. Wall motion was normal; there  were no regional wall   motion abnormalities. Left ventricular diastolic function   parameters were normal. - Aortic valve: Valve area (VTI): 1.63 cm^2. Valve area (Vmax):   1.48 cm^2. Valve area (Vmean): 1.61 cm^2. - Left atrium: The atrium was moderately to severely dilated. - Right atrium: The atrium was moderately dilated. - Tricuspid valve: Peak RV-RA gradient (S): 18 mm Hg. - Pulmonary arteries: PA peak pressure: 33 mm Hg (S).   Impressions:   - Normal left and right ventricular chamber size and function, with   LVEF 55-60%. No hemodynamically significant valve disease.   Biatrial enlargement. RVSP approximately 27-32 mmHg.  Monitor 08/07/18 4 days, 1 hour of interpretable data on Zio [facility]. No patient triggered events, and one auto recorded event of SVT. No sustained VT, pauses, or high degree AV block noted.   Patient had a min HR of 45 bpm, max HR of 141 bpm, and avg HR of 74 bpm. Predominant underlying rhythm was Sinus Rhythm. Intermittent Bundle Branch Block was present.    1 run of Supraventricular Tachycardia occurred lasting 6 beats with a max rate of 141 bpm (avg 134 bpm). Isolated SVEs were rare (<1.0%), SVE Couplets were rare (<1.0%), and SVE Triplets were rare (<1.0%). Isolated VEs were frequent (18.0%, [id number]), VE Couplets were rare (<1.0%, 452), and VE Triplets were rare (<1.0%, 7). Ventricular Bigeminy and Trigeminy were present.  EKG:  EKG was personally reviewed 01/01/22: not ordered today 12/22/19: sinus rhythm with ventricular trigeminy  Recent Labs: 01/26/2021: ALT 15; BUN 23; Creatinine, Ser 0.82; Hemoglobin 13.5; Platelets 262.0; Potassium 4.1; Sodium 140  Recent Lipid Panel    Component Value Date/Time   CHOL 247 (H) 01/26/2021 0923   TRIG 53.0 01/26/2021 0923   TRIG 54 08/26/2006 0925   HDL 86.40 01/26/2021 0923   CHOLHDL 3 01/26/2021 0923   VLDL 10.6 01/26/2021 0923   LDLCALC 150 (H) 01/26/2021 0923   LDLDIRECT 118.4 07/10/2012 0849    Physical Exam:     VS:  BP 138/76 (BP Location: Right Arm, Patient Position: Sitting, Cuff Size: Normal)    Pulse 84    Ht '5\' 1"'$  (1.549 m)    Wt 153 lb 12.8 oz (69.8 kg)    SpO2 97%    BMI 29.06 kg/m     Wt Readings from Last 3 Encounters:  01/01/22 153 lb 12.8 oz (69.8 kg)  04/20/21 153 lb 6.4 oz (69.6 kg)  03/10/21 152 lb 12.8 oz (69.3 kg)    GEN: Well nourished, well developed in no acute distress HEENT: Normal, moist mucous membranes NECK: No JVD CARDIAC: regular rhythm with rare premature beat (1 PVC in 20 beats), normal S1 and S2, no rubs or gallops. 2/6 HSM. VASCULAR: Radial and DP pulses 2+ bilaterally. No carotid bruits RESPIRATORY:  Clear to auscultation without rales, wheezing or rhonchi  ABDOMEN: Soft, non-tender,  non-distended MUSCULOSKELETAL:  Ambulates independently SKIN: Warm and dry, no LE edema NEUROLOGIC:  Alert and oriented x 3. No focal neuro deficits noted. PSYCHIATRIC:  Normal affect   ASSESSMENT:    1. PVC (premature ventricular contraction)   2. Elevated blood pressure reading   3. Murmur, cardiac   4. Nonrheumatic mitral valve regurgitation   5. Nonrheumatic tricuspid valve regurgitation   6. Cardiac risk counseling   7. Counseling on health promotion and disease prevention     PLAN:    Palpitations, bigeminy/trigeminy, high PVC burden:  -Monitor shoes PVC burden of 18%, with occasional bigeminy and trigeminy. One episode of SVT as well.  -Echo without clear structural abnormality. -nuclear treadmill without evidence of ischemia -have discussed both beta blockers and calcium channels in the past. She would like to continue without medications as long as possible -only notices with significant activity, not limiting -burden about 5% by auscultation today -we have discussed red flags that need immediate medical attention  Intermittent elevated blood pressure readings: improved today after resting/recheck -continue to monitor, has not required medication  Murmur:   -MR/TR on echo, not severe  CV risk counseling and prevention: -recommend heart healthy/Mediterranean diet, with whole grains, fruits, vegetable, fish, lean meats, nuts, and olive oil. Limit salt. -recommend moderate walking, 3-5 times/week for 30-50 minutes each session. Aim for at least 150 minutes.week. Goal should be pace of 3 miles/hours, or walking 1.5 miles in 30 minutes -recommend avoidance of tobacco products. Avoid excess alcohol. -ASCVD risk score: The 10-year ASCVD risk score (Arnett DK, et al., 2019) is: 15%   Values used to calculate the score:     Age: 61 years     Sex: Female     Is Non-Hispanic African American: No     Diabetic: No     Tobacco smoker: No     Systolic Blood Pressure: 440 mmHg     Is BP treated: No     HDL Cholesterol: 86.4 mg/dL     Total Cholesterol: 247 mg/dL   Plan for follow up: 1 year or sooner PRN  Medication Adjustments/Labs and Tests Ordered: Current medicines are reviewed at length with the patient today.  Concerns regarding medicines are outlined above.  No orders of the defined types were placed in this encounter.  No orders of the defined types were placed in this encounter.   Patient Instructions  Medication Instructions:  Your Physician recommend you continue on your current medication as directed.    *If you need a refill on your cardiac medications before your next appointment, please call your pharmacy*   Lab Work: None ordered today   Testing/Procedures: None ordered today   Follow-Up: At [facility], you and your health needs are our priority.  As part of our continuing mission to provide you with exceptional heart care, we have created designated Provider Care Teams.  These Care Teams include your primary Cardiologist (physician) and Advanced Practice Providers (APPs -  Physician Assistants and Nurse Practitioners) who all work together to provide you with the care you need, when you need it.  We recommend signing  up for the patient portal called "MyChart".  Sign up information is provided on this After Visit Summary.  MyChart is used to connect with patients for Virtual Visits (Telemedicine).  Patients are able to view lab/test results, encounter notes, upcoming appointments, etc.  Non-urgent messages can be sent to your provider as well.   To learn more about what you can do with  MyChart, go to [email or web address].    Your next appointment:   1 year(s)  The format for your next appointment:   In Person  Provider:   Buford Dresser, MD{     Wilhemina Bonito as a scribe for Buford Dresser, MD.,have documented all relevant documentation on the behalf of Buford Dresser, MD,as directed by  Buford Dresser, MD while in the presence of Buford Dresser, MD.  I, Buford Dresser, MD, have reviewed all documentation for this visit. The documentation on 01/01/22 for the exam, diagnosis, procedures, and orders are all accurate and complete.   Signed, Buford Dresser, MD PhD 01/01/2022  [place]

## 2022-01-01 NOTE — Patient Instructions (Signed)

## 2022-03-12 ENCOUNTER — Other Ambulatory Visit: Payer: Self-pay | Admitting: Family Medicine

## 2022-03-12 DIAGNOSIS — M85859 Other specified disorders of bone density and structure, unspecified thigh: Secondary | ICD-10-CM

## 2022-05-15 NOTE — Progress Notes (Unsigned)
[place] at [facility] [address], [place], [place] [address] [phone]  Date:  05/17/2022   Name:  Shaquina Gillham   DOB:  08-31-1946   MRN:  [id number]  PCP:  Darreld Mclean, MD    Chief Complaint: No chief complaint on file.   History of Present Illness:  Lynell Greenhouse is a 76 y.o. very pleasant female patient who presents with the following:  Patient seen today for physical exam Most recent visit with myself April of last year- history of hyperlipidemia, frequent PVCs, rosacea, osteopenia with increased hip fracture risk on antiresorptive therapy, high PVC burden, follows up with cardiology  Her only current prescription is Fosamax, she also takes calcium and vitamin D  She was seen by Dr. Harrell Gave in March: Palpitations, bigeminy/trigeminy, high PVC burden:  -Monitor shoes PVC burden of 18%, with occasional bigeminy and trigeminy. One episode of SVT as well.  -Echo without clear structural abnormality. -nuclear treadmill without evidence of ischemia -have discussed both beta blockers and calcium channels in the past. She would like to continue without medications as long as possible -only notices with significant activity, not limiting -burden about 5% by auscultation today -we have discussed red flags that need immediate medical attention  Intermittent elevated blood pressure readings: improved today after resting/recheck -continue to monitor, has not required medication  Murmur:  -MR/TR on echo, not severe   Can update labs today Can also update bone density Mammogram done April 22 Colon cancer screening may be due Patient Active Problem List   Diagnosis Date Noted   Nonrheumatic tricuspid valve regurgitation 01/01/2022   Nonrheumatic mitral valve regurgitation 01/01/2022   Prediabetes 07/24/2019   Vitamin D deficiency 07/24/2019   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   Murmur, cardiac 07/23/2018    PVC (premature ventricular contraction) 07/23/2018   Hyperlipidemia 02/07/2015   Overweight (BMI 25.0-29.9) 08/05/2014   Osteopenia 07/10/2012   ROSACEA 12/22/2010   CARPAL TUNNEL SYNDROME 10/20/2007   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   LACTOSE INTOLERANCE 01/16/2007   BREAST MASS, BENIGN 01/16/2007    Past Medical History:  Diagnosis Date   Breast lump    Hyperlipidemia     Past Surgical History:  Procedure Laterality Date   CARPAL TUNNEL RELEASE  11-2014   TOOTH EXTRACTION  07-2011    Social History   Tobacco Use   Smoking status: Never   Smokeless tobacco: Never  Substance Use Topics   Alcohol use: No    Alcohol/week: 0.0 standard drinks of alcohol   Drug use: No    Family History  Adopted: Yes  Problem Relation Age of Onset   Stroke Father    Alcohol abuse Father    Hypertension Father    Alzheimer's disease Mother    Hypertension Brother    Colon cancer Neg Hx     Allergies  Allergen Reactions   Cinnamon     Headache, breathing problem to long exposure    Medication list has been reviewed and updated.  Current Outpatient Medications on File Prior to Visit  Medication Sig Dispense Refill   alendronate (FOSAMAX) 70 MG tablet TAKE 1 TABLET BY MOUTH ONCE A WEEK ON AN EMPTY STOMACH WITH  A  FULL  GLASS  OF  WATER 12 tablet 0   calcium-vitamin D (OSCAL WITH D) 500-200 MG-UNIT tablet Take 1 tablet by mouth.     Cholecalciferol (VITAMIN D3) 1.25 MG (50000 UT) CAPS Take 1 weekly for  12 weeks 12 capsule 0   No current facility-administered medications on file prior to visit.    Review of Systems:  As per HPI- otherwise negative.   Physical Examination: There were no vitals filed for this visit. There were no vitals filed for this visit. There is no height or weight on file to calculate BMI. Ideal Body Weight:    GEN: no acute distress. HEENT: Atraumatic, Normocephalic.  Ears and Nose: No external deformity. CV: RRR, No M/G/R. No JVD. No thrill. No  extra heart sounds. PULM: CTA B, no wheezes, crackles, rhonchi. No retractions. No resp. distress. No accessory muscle use. ABD: S, NT, ND, +BS. No rebound. No HSM. EXTR: No c/c/e PSYCH: Normally interactive. Conversant.    Assessment and Plan: *** Physical exam today.  Encouraged healthy diet and exercise routine Will plan further follow- up pending labs.  Signed Lamar Blinks, MD

## 2022-05-15 NOTE — Patient Instructions (Incomplete)
It was great to see you again today, I will be in touch with your labs If you like, you can get the newer shingles series/Shingrix at your pharmacy I ordered your bone density and mammogram today I will ask your GI doc (DR Henrene Pastor) to reach out to you and get you scheduled for your screening  Try to keep cool, keep your feet elevated when you can and use your compression socks as needed

## 2022-05-17 ENCOUNTER — Encounter: Payer: Self-pay | Admitting: Family Medicine

## 2022-05-17 ENCOUNTER — Ambulatory Visit (INDEPENDENT_AMBULATORY_CARE_PROVIDER_SITE_OTHER): Payer: Medicare PPO | Admitting: Family Medicine

## 2022-05-17 ENCOUNTER — Other Ambulatory Visit (HOSPITAL_BASED_OUTPATIENT_CLINIC_OR_DEPARTMENT_OTHER): Payer: Self-pay | Admitting: Family Medicine

## 2022-05-17 ENCOUNTER — Telehealth: Payer: Self-pay

## 2022-05-17 VITALS — BP 122/80 | HR 76 | Temp 97.6°F | Resp 18 | Ht 62.0 in | Wt 157.4 lb

## 2022-05-17 DIAGNOSIS — E2839 Other primary ovarian failure: Secondary | ICD-10-CM

## 2022-05-17 DIAGNOSIS — Z0000 Encounter for general adult medical examination without abnormal findings: Secondary | ICD-10-CM | POA: Diagnosis not present

## 2022-05-17 DIAGNOSIS — Z131 Encounter for screening for diabetes mellitus: Secondary | ICD-10-CM

## 2022-05-17 DIAGNOSIS — E559 Vitamin D deficiency, unspecified: Secondary | ICD-10-CM

## 2022-05-17 DIAGNOSIS — R600 Localized edema: Secondary | ICD-10-CM | POA: Diagnosis not present

## 2022-05-17 DIAGNOSIS — E785 Hyperlipidemia, unspecified: Secondary | ICD-10-CM

## 2022-05-17 DIAGNOSIS — Z130 Encounter for screening for diseases of the blood and blood-forming organs and certain disorders involving the immune mechanism: Secondary | ICD-10-CM | POA: Diagnosis not present

## 2022-05-17 DIAGNOSIS — Z1231 Encounter for screening mammogram for malignant neoplasm of breast: Secondary | ICD-10-CM

## 2022-05-17 DIAGNOSIS — M8580 Other specified disorders of bone density and structure, unspecified site: Secondary | ICD-10-CM

## 2022-05-17 DIAGNOSIS — I493 Ventricular premature depolarization: Secondary | ICD-10-CM

## 2022-05-17 LAB — COMPREHENSIVE METABOLIC PANEL
ALT: 11 U/L (ref 0–35)
AST: 18 U/L (ref 0–37)
Albumin: 4.1 g/dL (ref 3.5–5.2)
Alkaline Phosphatase: 48 U/L (ref 39–117)
BUN: 22 mg/dL (ref 6–23)
CO2: 29 mEq/L (ref 19–32)
Calcium: 8.9 mg/dL (ref 8.4–10.5)
Chloride: 105 mEq/L (ref 96–112)
Creatinine, Ser: 0.88 mg/dL (ref 0.40–1.20)
GFR: 65 mL/min (ref >60.00)
Glucose, Bld: 96 mg/dL (ref 70–99)
Potassium: 4 mEq/L (ref 3.5–5.1)
Sodium: 140 mEq/L (ref 135–145)
Total Bilirubin: 0.4 mg/dL (ref 0.2–1.2)
Total Protein: 6 g/dL (ref 6.0–8.3)

## 2022-05-17 LAB — LIPID PANEL
Cholesterol: 234 mg/dL — ABNORMAL HIGH (ref 0–200)
HDL: 80.1 mg/dL (ref >39.00)
LDL Cholesterol: 144 mg/dL — ABNORMAL HIGH (ref 0–99)
NonHDL: 153.86
Total CHOL/HDL Ratio: 3
Triglycerides: 51 mg/dL (ref 0.0–149.0)
VLDL: 10.2 mg/dL (ref 0.0–40.0)

## 2022-05-17 LAB — CBC
HCT: 39.2 % (ref 36.0–46.0)
Hemoglobin: 12.8 g/dL (ref 12.0–15.0)
MCHC: 32.5 g/dL (ref 30.0–36.0)
MCV: 92.2 fl (ref 78.0–100.0)
Platelets: 252 10*3/uL (ref 150.0–400.0)
RBC: 4.26 Mil/uL (ref 3.87–5.11)
RDW: 12.3 % (ref 11.5–15.5)
WBC: 4.3 10*3/uL (ref 4.0–10.5)

## 2022-05-17 LAB — BRAIN NATRIURETIC PEPTIDE: Pro B Natriuretic peptide (BNP): 133 pg/mL — ABNORMAL HIGH (ref 0.0–100.0)

## 2022-05-17 LAB — TSH: TSH: 2.22 u[IU]/mL (ref 0.35–5.50)

## 2022-05-17 LAB — HEMOGLOBIN A1C: Hgb A1c MFr Bld: 6 % (ref 4.6–6.5)

## 2022-05-17 NOTE — Telephone Encounter (Signed)
-----   Message from Irene Shipper, MD sent at 05/17/2022 11:50 AM EDT ----- Regarding: Surveillance colonoscopy Nurses,  Dr. Lorelei Pont reached out to me regarding this patient's surveillance colonoscopy.  Please help arrange routine surveillance colonoscopy in the [place] with me.  Thanks,  Dr. Henrene Pastor

## 2022-05-17 NOTE — Telephone Encounter (Signed)
Pt was notified of Dr. Henrene Pastor recommendation for a Colonoscopy: Pt was notified that we could schedule her for a Colonoscopy and previsit appointment: Pt was notified that she would need to have a Care Partner to stay with her during the time of her procedure: Pt stated that she would have to ask several friends and call us back and schedule: Several dates and times were given to pt for availability for a colonoscopy with Dr. Henrene Pastor.  Pt verbalized understanding with all questions answered.

## 2022-05-22 ENCOUNTER — Telehealth: Payer: Self-pay | Admitting: Cardiology

## 2022-05-22 NOTE — Telephone Encounter (Signed)
Discussed labs with patient and read her Dr Pennelope Bracken message regarding labs  Patient did say her swelling she had is better since using the compression socks, goes down at night  Does have some change in breathing with exertion but aware hotter and weight is up  She is going work on diet and weight loss  Will call if anything changes   Would like Dr Harrell Gave to review labs and call if any further recommendations, will forward for review

## 2022-05-22 NOTE — Telephone Encounter (Signed)
Patient was calling to get results of her lab work

## 2022-05-24 ENCOUNTER — Encounter: Payer: Self-pay | Admitting: Internal Medicine

## 2022-05-30 NOTE — Telephone Encounter (Signed)
Patient returning your call.

## 2022-05-30 NOTE — Telephone Encounter (Signed)
I reviewed her labs--I agree with the compression sock recommendations. I would continue what she is doing for now. If breathing/swelling gets worse despite the changes she is making, please let us know.

## 2022-05-30 NOTE — Telephone Encounter (Signed)
Left message to call back  

## 2022-05-30 NOTE — Telephone Encounter (Signed)
Patient is returning call.  °

## 2022-05-31 NOTE — Telephone Encounter (Signed)
Pt updated and verbalized understanding.  

## 2022-06-12 ENCOUNTER — Ambulatory Visit (HOSPITAL_BASED_OUTPATIENT_CLINIC_OR_DEPARTMENT_OTHER)
Admission: RE | Admit: 2022-06-12 | Discharge: 2022-06-12 | Disposition: A | Discharge status: Home / Self Care | Payer: Medicare PPO | Source: Ambulatory Visit | Attending: Family Medicine | Admitting: Family Medicine

## 2022-06-12 ENCOUNTER — Ambulatory Visit (AMBULATORY_SURGERY_CENTER): Payer: Medicare PPO

## 2022-06-12 ENCOUNTER — Encounter: Payer: Self-pay | Admitting: Family Medicine

## 2022-06-12 ENCOUNTER — Encounter (HOSPITAL_BASED_OUTPATIENT_CLINIC_OR_DEPARTMENT_OTHER): Payer: Self-pay

## 2022-06-12 DIAGNOSIS — Z1231 Encounter for screening mammogram for malignant neoplasm of breast: Secondary | ICD-10-CM | POA: Insufficient documentation

## 2022-06-12 DIAGNOSIS — M8580 Other specified disorders of bone density and structure, unspecified site: Secondary | ICD-10-CM

## 2022-06-12 DIAGNOSIS — M85832 Other specified disorders of bone density and structure, left forearm: Secondary | ICD-10-CM | POA: Diagnosis not present

## 2022-06-12 DIAGNOSIS — E2839 Other primary ovarian failure: Secondary | ICD-10-CM | POA: Diagnosis not present

## 2022-06-12 DIAGNOSIS — Z86010 Personal history of colonic polyps: Secondary | ICD-10-CM

## 2022-06-12 DIAGNOSIS — M85851 Other specified disorders of bone density and structure, right thigh: Secondary | ICD-10-CM | POA: Diagnosis not present

## 2022-06-12 MED ORDER — NA SULFATE-K SULFATE-MG SULF 17.5-3.13-1.6 GM/177ML PO SOLN: 1.0000 | ORAL | 0 refills | Status: DC

## 2022-06-12 NOTE — Progress Notes (Signed)
No egg or soy allergy known to patient  No issues known to pt with past sedation with any surgeries or procedures Patient denies ever being told they had issues or difficulty with intubation  No FH of Malignant Hyperthermia Pt is not on diet pills Pt is not on  home 02  Pt is not on blood thinners  Pt denies issues with constipation  No A fib or A flutter Have any cardiac testing pending--denied Pt instructed to use [email or web address][email or web address] or GoodRx for a price reduction on prep   

## 2022-06-12 NOTE — Addendum Note (Signed)
Addended by: Verdis Prime D on: 06/12/2022 01:28 PM   Modules accepted: Orders

## 2022-06-18 ENCOUNTER — Encounter: Payer: Self-pay | Admitting: Internal Medicine

## 2022-06-18 ENCOUNTER — Telehealth: Payer: Self-pay | Admitting: Family Medicine

## 2022-06-18 NOTE — Telephone Encounter (Signed)
Left message for patient to call back and schedule Medicare Annual Wellness Visit (AWV).   Please offer to do virtually or by telephone.  Left office number and my jabber [phone].  Last AWV:04/20/2021  Please schedule at anytime with Nurse Health Advisor.

## 2022-06-28 ENCOUNTER — Ambulatory Visit (INDEPENDENT_AMBULATORY_CARE_PROVIDER_SITE_OTHER): Payer: Medicare PPO | Admitting: *Deleted

## 2022-06-28 DIAGNOSIS — Z0000 Encounter for general adult medical examination without abnormal findings: Secondary | ICD-10-CM

## 2022-06-28 NOTE — Patient Instructions (Signed)
Marilyn Hess , Thank you for taking time to come for your Medicare Wellness Visit. I appreciate your ongoing commitment to your health goals. Please review the following plan we discussed and let me know if I can assist you in the future.   These are the goals we discussed:  Goals       Begin oraganizing home (pt-stated)      Continue exercising (pt-stated)      DIET - INCREASE WATER INTAKE        This is a list of the screening recommended for you and due dates:  Health Maintenance  Topic Date Due   Zoster (Shingles) Vaccine (1 of 2) Never done   Colon Cancer Screening  10/04/2020   COVID-19 Vaccine (5 - Pfizer series) 03/23/2021   Flu Shot  05/22/2022   Mammogram  06/13/2023   Tetanus Vaccine  06/06/2025   Pneumonia Vaccine  Completed   DEXA scan (bone density measurement)  Completed   Hepatitis C Screening: USPSTF Recommendation to screen - Ages 13-79 yo.  Completed   HPV Vaccine  Aged Out       Next appointment: Follow up in one year for your annual wellness visit    Preventive Care 65 Years and Older, Female Preventive care refers to lifestyle choices and visits with your health care provider that can promote health and wellness. What does preventive care include? A yearly physical exam. This is also called an annual well check. Dental exams once or twice a year. Routine eye exams. Ask your health care provider how often you should have your eyes checked. Personal lifestyle choices, including: Daily care of your teeth and gums. Regular physical activity. Eating a healthy diet. Avoiding tobacco and drug use. Limiting alcohol use. Practicing safe sex. Taking low-dose aspirin every day. Taking vitamin and mineral supplements as recommended by your health care provider. What happens during an annual well check? The services and screenings done by your health care provider during your annual well check will depend on your age, overall health, lifestyle risk factors,  and family history of disease. Counseling  Your health care provider may ask you questions about your: Alcohol use. Tobacco use. Drug use. Emotional well-being. Home and relationship well-being. Sexual activity. Eating habits. History of falls. Memory and ability to understand (cognition). Work and work Statistician. Reproductive health. Screening  You may have the following tests or measurements: Height, weight, and BMI. Blood pressure. Lipid and cholesterol levels. These may be checked every 5 years, or more frequently if you are over 63 years old. Skin check. Lung cancer screening. You may have this screening every year starting at age 30 if you have a 30-pack-year history of smoking and currently smoke or have quit within the past 15 years. Fecal occult blood test (FOBT) of the stool. You may have this test every year starting at age 24. Flexible sigmoidoscopy or colonoscopy. You may have a sigmoidoscopy every 5 years or a colonoscopy every 10 years starting at age 76. Hepatitis C blood test. Hepatitis B blood test. Sexually transmitted disease (STD) testing. Diabetes screening. This is done by checking your blood sugar (glucose) after you have not eaten for a while (fasting). You may have this done every 1-3 years. Bone density scan. This is done to screen for osteoporosis. You may have this done starting at age 71. Mammogram. This may be done every 1-2 years. Talk to your health care provider about how often you should have regular mammograms. Talk with your  health care provider about your test results, treatment options, and if necessary, the need for more tests. Vaccines  Your health care provider may recommend certain vaccines, such as: Influenza vaccine. This is recommended every year. Tetanus, diphtheria, and acellular pertussis (Tdap, Td) vaccine. You may need a Td booster every 10 years. Zoster vaccine. You may need this after age 41. Pneumococcal 13-valent conjugate  (PCV13) vaccine. One dose is recommended after age 5. Pneumococcal polysaccharide (PPSV23) vaccine. One dose is recommended after age 69. Talk to your health care provider about which screenings and vaccines you need and how often you need them. This information is not intended to replace advice given to you by your health care provider. Make sure you discuss any questions you have with your health care provider. Document Released: 11/04/2015 Document Revised: 06/27/2016 Document Reviewed: 08/09/2015 Elsevier Interactive Patient Education  2017 [employer] Prevention in the Home Falls can cause injuries. They can happen to people of all ages. There are many things you can do to make your home safe and to help prevent falls. What can I do on the outside of my home? Regularly fix the edges of walkways and driveways and fix any cracks. Remove anything that might make you trip as you walk through a door, such as a raised step or threshold. Trim any bushes or trees on the path to your home. Use bright outdoor lighting. Clear any walking paths of anything that might make someone trip, such as rocks or tools. Regularly check to see if handrails are loose or broken. Make sure that both sides of any steps have handrails. Any raised decks and porches should have guardrails on the edges. Have any leaves, snow, or ice cleared regularly. Use sand or salt on walking paths during winter. Clean up any spills in your garage right away. This includes oil or grease spills. What can I do in the bathroom? Use night lights. Install grab bars by the toilet and in the tub and shower. Do not use towel bars as grab bars. Use non-skid mats or decals in the tub or shower. If you need to sit down in the shower, use a plastic, non-slip stool. Keep the floor dry. Clean up any water that spills on the floor as soon as it happens. Remove soap buildup in the tub or shower regularly. Attach bath mats securely with  double-sided non-slip rug tape. Do not have throw rugs and other things on the floor that can make you trip. What can I do in the bedroom? Use night lights. Make sure that you have a light by your bed that is easy to reach. Do not use any sheets or blankets that are too big for your bed. They should not hang down onto the floor. Have a firm chair that has side arms. You can use this for support while you get dressed. Do not have throw rugs and other things on the floor that can make you trip. What can I do in the kitchen? Clean up any spills right away. Avoid walking on wet floors. Keep items that you use a lot in easy-to-reach places. If you need to reach something above you, use a strong step stool that has a grab bar. Keep electrical cords out of the way. Do not use floor polish or wax that makes floors slippery. If you must use wax, use non-skid floor wax. Do not have throw rugs and other things on the floor that can make you trip. What  can I do with my stairs? Do not leave any items on the stairs. Make sure that there are handrails on both sides of the stairs and use them. Fix handrails that are broken or loose. Make sure that handrails are as long as the stairways. Check any carpeting to make sure that it is firmly attached to the stairs. Fix any carpet that is loose or worn. Avoid having throw rugs at the top or bottom of the stairs. If you do have throw rugs, attach them to the floor with carpet tape. Make sure that you have a light switch at the top of the stairs and the bottom of the stairs. If you do not have them, ask someone to add them for you. What else can I do to help prevent falls? Wear shoes that: Do not have high heels. Have rubber bottoms. Are comfortable and fit you well. Are closed at the toe. Do not wear sandals. If you use a stepladder: Make sure that it is fully opened. Do not climb a closed stepladder. Make sure that both sides of the stepladder are locked  into place. Ask someone to hold it for you, if possible. Clearly mark and make sure that you can see: Any grab bars or handrails. First and last steps. Where the edge of each step is. Use tools that help you move around (mobility aids) if they are needed. These include: Canes. Walkers. Scooters. Crutches. Turn on the lights when you go into a dark area. Replace any light bulbs as soon as they burn out. Set up your furniture so you have a clear path. Avoid moving your furniture around. If any of your floors are uneven, fix them. If there are any pets around you, be aware of where they are. Review your medicines with your doctor. Some medicines can make you feel dizzy. This can increase your chance of falling. Ask your doctor what other things that you can do to help prevent falls. This information is not intended to replace advice given to you by your health care provider. Make sure you discuss any questions you have with your health care provider. Document Released: 08/04/2009 Document Revised: 03/15/2016 Document Reviewed: 11/12/2014 Elsevier Interactive Patient Education  2017 [employer].

## 2022-06-28 NOTE — Progress Notes (Signed)
Subjective:   Yamilka Lopiccolo is a 76 y.o. female who presents for Medicare Annual (Subsequent) preventive examination. I connected with  Scherrie Bateman on 06/28/22 by a audio enabled telemedicine application and verified that I am speaking with the correct person using two identifiers.  Patient Location: Home  Provider Location: Office/Clinic  I discussed the limitations of evaluation and management by telemedicine. The patient expressed understanding and agreed to proceed.   Review of Systems    Defer to PCP Cardiac Risk Factors include: dyslipidemia;advanced age (>86mn, >>2women)     Objective:    There were no vitals filed for this visit. There is no height or weight on file to calculate BMI.     06/28/2022    8:35 AM 04/20/2021    9:13 AM 04/14/2020    9:41 AM 04/13/2019   10:11 AM 04/11/2018   11:09 AM 04/08/2017    9:34 AM 09/21/2015    9:49 AM  Advanced Directives  Does Patient Have a Medical Advance Directive? No Yes No No No No No  Type of ASocial research officer, governmentLiving will       Does patient want to make changes to medical advance directive?   No - Patient declined      Copy of HFabensin Chart?  No - copy requested       Would patient like information on creating a medical advance directive? No - Patient declined   No - Patient declined Yes (MAU/Ambulatory/Procedural Areas - Information given) Yes (MAU/Ambulatory/Procedural Areas - Information given) No - patient declined information    Current Medications (verified) Outpatient Encounter Medications as of 06/28/2022  Medication Sig   alendronate (FOSAMAX) 70 MG tablet TAKE 1 TABLET BY MOUTH ONCE A WEEK ON AN EMPTY STOMACH WITH  A  FULL  GLASS  OF  WATER   calcium-vitamin D (OSCAL WITH D) 500-200 MG-UNIT tablet Take 1 tablet by mouth. (Patient not taking: Reported on 06/12/2022)   Cholecalciferol (VITAMIN D3) 1.25 MG (50000 UT) CAPS Take 1 weekly for 12 weeks (Patient  not taking: Reported on 06/12/2022)   [DISCONTINUED] Na Sulfate-K Sulfate-Mg Sulf 17.5-3.13-1.6 GM/177ML SOLN Take 1 kit by mouth as directed. May use generic Suprep, no prior authorization. Take as directed.   No facility-administered encounter medications on file as of 06/28/2022.    Allergies (verified) Cinnamon   History: Past Medical History:  Diagnosis Date   Breast lump    Heart murmur    Hyperlipidemia    PVC's (premature ventricular contractions)    Past Surgical History:  Procedure Laterality Date   CARPAL TUNNEL RELEASE  11/22/2014   COLONOSCOPY  2016   FOOT FRACTURE SURGERY     TOOTH EXTRACTION  07/23/2011   Family History  Adopted: Yes  Problem Relation Age of Onset   Alzheimer's disease Mother    Stroke Father    Alcohol abuse Father    Hypertension Father    Hypertension Brother    Colon cancer Neg Hx    Colon polyps Neg Hx    Esophageal cancer Neg Hx    Stomach cancer Neg Hx    Rectal cancer Neg Hx    Social History   Socioeconomic History   Marital status: Divorced    Spouse name: Not on file   Number of children: Not on file   Years of education: Not on file   Highest education level: Not on file  Occupational History   Not on  file  Tobacco Use   Smoking status: Never   Smokeless tobacco: Never  Substance and Sexual Activity   Alcohol use: No    Alcohol/week: 0.0 standard drinks of alcohol   Drug use: No   Sexual activity: Never  Other Topics Concern   Not on file  Social History Narrative   Not on file   Social Determinants of Health   Financial Resource Strain: Low Risk  (04/20/2021)   Overall Financial Resource Strain (CARDIA)    Difficulty of Paying Living Expenses: Not hard at all  Food Insecurity: No Food Insecurity (04/20/2021)   Hunger Vital Sign    Worried About Running Out of Food in the Last Year: Never true    Ran Out of Food in the Last Year: Never true  Transportation Needs: No Transportation Needs (04/20/2021)   PRAPARE  - Hydrologist (Medical): No    Lack of Transportation (Non-Medical): No  Physical Activity: Inactive (04/20/2021)   Exercise Vital Sign    Days of Exercise per Week: 0 days    Minutes of Exercise per Session: 0 min  Stress: No Stress Concern Present (04/20/2021)   [place]    Feeling of Stress : Not at all  Social Connections: Moderately Isolated (04/20/2021)   Social Connection and Isolation Panel [NHANES]    Frequency of Communication with Friends and Family: More than three times a week    Frequency of Social Gatherings with Friends and Family: More than three times a week    Attends Religious Services: More than 4 times per year    Active Member of [employer] or Organizations: No    Attends Music therapist: Never    Marital Status: Divorced    Tobacco Counseling Counseling given: Not Answered   Clinical Intake:  Pre-visit preparation completed: Yes  Pain : No/denies pain     Diabetes: No  How often do you need to have someone help you when you read instructions, pamphlets, or other written materials from your doctor or pharmacy?: 1 - Never  Diabetic? No  Interpreter Needed?: No  Information entered by :: Beatris Ship, [place]   Activities of Daily Living    06/28/2022    8:40 AM  In your present state of health, do you have any difficulty performing the following activities:  Hearing? 0  Vision? 0  Difficulty concentrating or making decisions? 0  Walking or climbing stairs? 0  Dressing or bathing? 0  Doing errands, shopping? 0  Preparing Food and eating ? N  Using the Toilet? N  In the past six months, have you accidently leaked urine? Y  Comment only if she waits too long to go to the restroom  Do you have problems with loss of bowel control? N  Managing your Medications? N  Managing your Finances? N  Housekeeping or managing your Housekeeping? N     Patient Care Team: Copland, Gay Filler, MD as PCP - General (Family Medicine) Buford Dresser, MD as PCP - Cardiology (Cardiology) Haverstock, Jennefer Bravo, MD as Referring Physician (Dermatology)  Indicate any recent Medical Services you may have received from other than [facility] providers in the past year (date may be approximate).     Assessment:   This is a routine wellness examination for C[place]ney.  Hearing/Vision screen No results found.  Dietary issues and exercise activities discussed: Current Exercise Habits: The patient does not participate in regular exercise at present,  Exercise limited by: None identified   Goals Addressed             This Visit's Progress    DIET - INCREASE WATER INTAKE   On track      Depression Screen    06/28/2022    8:38 AM 05/17/2022    8:54 AM 04/20/2021    9:19 AM 04/20/2021    9:11 AM 04/14/2020    9:49 AM 04/13/2019   10:18 AM 04/11/2018   11:10 AM  PHQ 2/9 Scores  PHQ - 2 Score 0 2 1 0 0 0 0  PHQ- 9 Score  2         Fall Risk    06/28/2022    8:38 AM 05/17/2022    8:54 AM 04/20/2021    9:17 AM 04/14/2020    9:48 AM 04/13/2019   10:18 AM  Fall Risk   Falls in the past year? 0 0 0 0 0  Number falls in past yr: 0 0 0 0   Injury with Fall? 0 0 0 0   Risk for fall due to : No Fall Risks      Follow up Falls evaluation completed  Falls prevention discussed Education provided;Falls prevention discussed     FALL RISK PREVENTION PERTAINING TO THE HOME:  Any stairs in or around the home? Yes  If so, are there any without handrails? No  Home free of loose throw rugs in walkways, pet beds, electrical cords, etc? Yes  Adequate lighting in your home to reduce risk of falls? Yes   ASSISTIVE DEVICES UTILIZED TO PREVENT FALLS:  Life alert? No  Use of a cane, walker or w/c? No  Grab bars in the bathroom? No  Shower chair or bench in shower? Yes  Elevated toilet seat or a handicapped toilet? Yes    Cognitive Function:         06/28/2022    8:53 AM  6CIT Screen  What Year? 0 points  What month? 0 points  What time? 0 points  Count back from 20 0 points  Months in reverse 0 points  Repeat phrase 0 points  Total Score 0 points    Immunizations Immunization History  Administered Date(s) Administered   Fluad Quad(high Dose 65+) 07/23/2019   Influenza, High Dose Seasonal PF 07/08/2017, 07/21/2018   Influenza,inj,Quad PF,6+ Mos 08/05/2013, 08/08/2015   PFIZER Comirnaty(Gray Top)Covid-19 Tri-Sucrose Vaccine 01/26/2021   PFIZER(Purple Top)SARS-COV-2 Vaccination 11/30/2019, 12/18/2019, 08/06/2020   Pneumococcal Conjugate-13 08/05/2014   Pneumococcal Polysaccharide-23 08/08/2015   Tdap 06/07/2015   Zoster, Live 02/21/2016    TDAP status: Up to date  Flu Vaccine status: Due, Education has been provided regarding the importance of this vaccine. Advised may receive this vaccine at local pharmacy or Health Dept. Aware to provide a copy of the vaccination record if obtained from local pharmacy or Health Dept. Verbalized acceptance and understanding.  Pneumococcal vaccine status: Up to date  Covid-19 vaccine status: Information provided on how to obtain vaccines.   Qualifies for Shingles Vaccine? Yes   Zostavax completed Yes   Shingrix Completed?: No.    Education has been provided regarding the importance of this vaccine. Patient has been advised to call insurance company to determine out of pocket expense if they have not yet received this vaccine. Advised may also receive vaccine at local pharmacy or Health Dept. Verbalized acceptance and understanding.  Screening Tests Health Maintenance  Topic Date Due   Zoster Vaccines- Shingrix (1 of 2) Never  done   COLONOSCOPY (Pts 45-26yr Insurance coverage will need to be confirmed)  10/04/2020   COVID-19 Vaccine (5 - Pfizer series) 03/23/2021   INFLUENZA VACCINE  05/22/2022   MAMMOGRAM  06/13/2023   TETANUS/TDAP  06/06/2025   Pneumonia Vaccine 76 Years old   Completed   DEXA SCAN  Completed   Hepatitis C Screening  Completed   HPV VACCINES  Aged Out    Health Maintenance  Health Maintenance Due  Topic Date Due   Zoster Vaccines- Shingrix (1 of 2) Never done   COLONOSCOPY (Pts 45-491yrInsurance coverage will need to be confirmed)  10/04/2020   COVID-19 Vaccine (5 - Pfizer series) 03/23/2021   INFLUENZA VACCINE  05/22/2022    Colon Cancer screening: colonoscopy scheduled 07/03/22.  Mammogram status: Completed 06/12/22. Repeat every year  Bone Density status: Completed 06/12/22. Results reflect: Bone density results: OSTEOPENIA. Repeat every 2 years.  Lung Cancer Screening: (Low Dose CT Chest recommended if Age 74-80ears, 30 pack-year currently smoking OR have quit w/in 15years.) does not qualify.   Lung Cancer Screening Referral: N/a  Additional Screening:  Hepatitis C Screening: does qualify; Completed 07/08/17  Vision Screening: Recommended annual ophthalmology exams for early detection of glaucoma and other disorders of the eye. Is the patient up to date with their annual eye exam?  Yes  Who is the provider or what is the name of the office in which the patient attends annual eye exams? Dr. [place]rian [place] pt is not established with a provider, would they like to be referred to a provider to establish care? No .   Dental Screening: Recommended annual dental exams for proper oral hygiene  Community Resource Referral / Chronic Care Management: CRR required this visit?  No   CCM required this visit?  No      Plan:     I have personally reviewed and noted the following in the patient's chart:   Medical and social history Use of alcohol, tobacco or illicit drugs  Current medications and supplements including opioid prescriptions. Patient is not currently taking opioid prescriptions. Functional ability and status Nutritional status Physical activity Advanced directives List of other physicians Hospitalizations, surgeries, and  ER visits in previous 12 months Vitals Screenings to include cognitive, depression, and falls Referrals and appointments  In addition, I have reviewed and discussed with patient certain preventive protocols, quality metrics, and best practice recommendations. A written personalized care plan for preventive services as well as general preventive health recommendations were provided to patient.   Due to this being a telephonic visit, the after visit summary with patients personalized plan was offered to patient via mail or my-chart. Patient was mailed a copy of AVS.  BeBeatris ShipCMLackawanna 06/28/2022   Nurse Notes: None

## 2022-07-03 ENCOUNTER — Ambulatory Visit (AMBULATORY_SURGERY_CENTER): Payer: Medicare PPO | Admitting: Internal Medicine

## 2022-07-03 ENCOUNTER — Encounter: Payer: Self-pay | Admitting: Internal Medicine

## 2022-07-03 VITALS — BP 144/82 | HR 66 | Temp 97.1°F | Resp 14 | Ht 62.0 in | Wt 155.0 lb

## 2022-07-03 DIAGNOSIS — Z09 Encounter for follow-up examination after completed treatment for conditions other than malignant neoplasm: Secondary | ICD-10-CM | POA: Diagnosis not present

## 2022-07-03 DIAGNOSIS — D122 Benign neoplasm of ascending colon: Secondary | ICD-10-CM

## 2022-07-03 DIAGNOSIS — Z86010 Personal history of colonic polyps: Secondary | ICD-10-CM

## 2022-07-03 DIAGNOSIS — I493 Ventricular premature depolarization: Secondary | ICD-10-CM | POA: Diagnosis not present

## 2022-07-03 MED ORDER — SODIUM CHLORIDE 0.9 % IV SOLN: 500.0000 mL | INTRAVENOUS | Status: DC

## 2022-07-03 NOTE — Progress Notes (Signed)
Called to room to assist during endoscopic procedure.  Patient ID and intended procedure confirmed with present staff. Received instructions for my participation in the procedure from the performing physician.  

## 2022-07-03 NOTE — Progress Notes (Signed)
Sedate, gd SR, tolerated procedure well, VSS, report to RN 

## 2022-07-03 NOTE — Progress Notes (Signed)
HISTORY OF PRESENT ILLNESS:  Marilyn Hess is a 76 y.o. female with a history of adenomatous colon polyps.  Presents today for surveillance colonoscopy.  Previous colonoscopy 2016.  No complaints.  REVIEW OF SYSTEMS:  All non-GI ROS negative. Past Medical History:  Diagnosis Date   Breast lump    Heart murmur    Hyperlipidemia    PVC's (premature ventricular contractions)     Past Surgical History:  Procedure Laterality Date   CARPAL TUNNEL RELEASE  11/22/2014   COLONOSCOPY  2016   FOOT FRACTURE SURGERY     TOOTH EXTRACTION  07/23/2011    Social History Marilyn Hess  reports that she has never smoked. She has never used smokeless tobacco. She reports that she does not drink alcohol and does not use drugs.  family history includes Alcohol abuse in her father; Alzheimer's disease in her mother; Hypertension in her brother and father; Stroke in her father. She was adopted.  Allergies  Allergen Reactions   Cinnamon     Headache, breathing problem to long exposure       PHYSICAL EXAMINATION: Vital signs: BP (!) 158/110   Pulse 67   Temp (!) 97.1 F (36.2 C) (Temporal)   Ht '5\' 2"'$  (1.575 m)   Wt 155 lb (70.3 kg)   BMI 28.35 kg/m  General: Well-developed, well-nourished, no acute distress HEENT: Sclerae are anicteric, conjunctiva pink. Oral mucosa intact Lungs: Clear Heart: Regular Abdomen: soft, nontender, nondistended, no obvious ascites, no peritoneal signs, normal bowel sounds. No organomegaly. Extremities: No edema Psychiatric: alert and oriented x3. Cooperative      ASSESSMENT:  Personal history of adenomatous colon polyps.   PLAN:   Surveillance colonoscopy

## 2022-07-03 NOTE — Patient Instructions (Signed)
-   Repeat colonoscopy is not recommended for surveillance.  - Patient has a contact number available for emergencies. The signs and symptoms of potential delayed complications were discussed with the patient. Return to normal activities tomorrow. Written discharge instructions were provided to the patient.  - Resume previous diet.   - Continue present medications.  -AWAIT PATHOLOGY RESULTS   YOU HAD AN ENDOSCOPIC PROCEDURE TODAY AT [facility]:   Refer to the procedure report that was given to you for any specific questions about what was found during the examination.  If the procedure report does not answer your questions, please call your gastroenterologist to clarify.  If you requested that your care partner not be given the details of your procedure findings, then the procedure report has been included in a sealed envelope for you to review at your convenience later.  YOU SHOULD EXPECT: Some feelings of bloating in the abdomen. Passage of more gas than usual.  Walking can help get rid of the air that was put into your GI tract during the procedure and reduce the bloating. If you had a lower endoscopy (such as a colonoscopy or flexible sigmoidoscopy) you may notice spotting of blood in your stool or on the toilet paper. If you underwent a bowel prep for your procedure, you may not have a normal bowel movement for a few days.  Please Note:  You might notice some irritation and congestion in your nose or some drainage.  This is from the oxygen used during your procedure.  There is no need for concern and it should clear up in a day or so.  SYMPTOMS TO REPORT IMMEDIATELY:  Following lower endoscopy (colonoscopy or flexible sigmoidoscopy):  Excessive amounts of blood in the stool  Significant tenderness or worsening of abdominal pains  Swelling of the abdomen that is new, acute  Fever of 100F or higher  For urgent or emergent issues, a gastroenterologist can be reached at any  hour by calling [phone]. Do not use MyChart messaging for urgent concerns.    DIET:  We do recommend a small meal at first, but then you may proceed to your regular diet.  Drink plenty of fluids but you should avoid alcoholic beverages for 24 hours.  ACTIVITY:  You should plan to take it easy for the rest of today and you should NOT DRIVE or use heavy machinery until tomorrow (because of the sedation medicines used during the test).    FOLLOW UP: Our staff will call the number listed on your records the next business day following your procedure.  We will call around 7:15- 8:00 am to check on you and address any questions or concerns that you may have regarding the information given to you following your procedure. If we do not reach you, we will leave a message.     If any biopsies were taken you will be contacted by phone or by letter within the next 1-3 weeks.  Please call us at [phone] if you have not heard about the biopsies in 3 weeks.    SIGNATURES/CONFIDENTIALITY: You and/or your care partner have signed paperwork which will be entered into your electronic medical record.  These signatures attest to the fact that that the information above on your After Visit Summary has been reviewed and is understood.  Full responsibility of the confidentiality of this discharge information lies with you and/or your care-partner.

## 2022-07-03 NOTE — Op Note (Signed)
[facility] Patient Name: Marilyn Hess Procedure Date: 07/03/2022 11:06 AM MRN: [id number] Endoscopist: Docia Chuck. Henrene Pastor , MD Age: 76 Referring MD:  Date of Birth: Nov 16, 1945 Gender: Female Account #: [id number] Procedure:                Colonoscopy with cold snare polypectomy x 1 Indications:              High risk colon cancer surveillance: Personal                            history of non-advanced adenoma (2016) Medicines:                Monitored Anesthesia Care Procedure:                Pre-Anesthesia Assessment:                           - Prior to the procedure, a History and Physical                            was performed, and patient medications and                            allergies were reviewed. The patient's tolerance of                            previous anesthesia was also reviewed. The risks                            and benefits of the procedure and the sedation                            options and risks were discussed with the patient.                            All questions were answered, and informed consent                            was obtained. Prior Anticoagulants: The patient has                            taken no previous anticoagulant or antiplatelet                            agents. After reviewing the risks and benefits, the                            patient was deemed in satisfactory condition to                            undergo the procedure.                           After obtaining informed consent, the colonoscope  was passed under direct vision. Throughout the                            procedure, the patient's blood pressure, pulse, and                            oxygen saturations were monitored continuously. The                            CF [id number] #[id number] was introduced through the anus                            and advanced to the the cecum, identified by                             appendiceal orifice and ileocecal valve. The                            ileocecal valve, appendiceal orifice, and rectum                            were photographed. The quality of the bowel                            preparation was excellent. The colonoscopy was                            performed without difficulty. The patient tolerated                            the procedure well. The bowel preparation used was                            SUPREP via split dose instruction. Scope In: 11:10:07 AM Scope Out: 11:22:16 AM Scope Withdrawal Time: 0 hours 8 minutes 11 seconds  Total Procedure Duration: 0 hours 12 minutes 9 seconds  Findings:                 A 2 mm polyp was found in the ascending colon. The                            polyp was sessile. The polyp was removed with a                            cold snare. Resection and retrieval were complete.                           Internal hemorrhoids were found during retroflexion.                           The exam was otherwise without abnormality on                            direct and retroflexion views.  Complications:            No immediate complications. Estimated blood loss:                            None. Estimated Blood Loss:     Estimated blood loss: none. Impression:               - One 2 mm polyp in the ascending colon, removed                            with a cold snare. Resected and retrieved.                           - Internal hemorrhoids.                           - The examination was otherwise normal on direct                            and retroflexion views. Recommendation:           - Repeat colonoscopy is not recommended for                            surveillance.                           - Patient has a contact number available for                            emergencies. The signs and symptoms of potential                            delayed complications were discussed with the                             patient. Return to normal activities tomorrow.                            Written discharge instructions were provided to the                            patient.                           - Resume previous diet.                           - Continue present medications.                           - Await pathology results. Docia Chuck. Henrene Pastor, MD 07/03/2022 11:28:02 AM This report has been signed electronically.

## 2022-07-04 ENCOUNTER — Telehealth: Payer: Self-pay | Admitting: *Deleted

## 2022-07-04 NOTE — Telephone Encounter (Signed)
  Follow up Call-     07/03/2022   10:41 AM  Call back number  Post procedure Call Back phone  # [phone]  Permission to leave phone message Yes     Patient questions:  Do you have a fever, pain , or abdominal swelling? No. Pain Score  0 *  Have you tolerated food without any problems? Yes.    Have you been able to return to your normal activities? Yes.    Do you have any questions about your discharge instructions: Diet   No. Medications  No. Follow up visit  No.  Do you have questions or concerns about your Care? No.  Actions: * If pain score is 4 or above: No action needed, pain <4.

## 2022-07-05 ENCOUNTER — Encounter: Payer: Self-pay | Admitting: Internal Medicine

## 2022-08-02 ENCOUNTER — Telehealth: Payer: Self-pay | Admitting: Family Medicine

## 2022-08-02 DIAGNOSIS — M85859 Other specified disorders of bone density and structure, unspecified thigh: Secondary | ICD-10-CM

## 2022-08-02 MED ORDER — ALENDRONATE SODIUM 70 MG PO TABS: ORAL_TABLET | ORAL | 0 refills | Status: DC

## 2022-08-02 NOTE — Telephone Encounter (Signed)
Patient states she lost her fosamax pills and needs a new script sent to her pharmacy. Please advise.  [place], [place]. [address] [place] [address] Phone: [phone]  Fax: [phone]

## 2022-08-02 NOTE — Telephone Encounter (Signed)
Rx sent 

## 2022-09-28 DIAGNOSIS — E119 Type 2 diabetes mellitus without complications: Secondary | ICD-10-CM | POA: Diagnosis not present

## 2022-09-28 DIAGNOSIS — H43813 Vitreous degeneration, bilateral: Secondary | ICD-10-CM | POA: Diagnosis not present

## 2022-09-28 DIAGNOSIS — H18413 Arcus senilis, bilateral: Secondary | ICD-10-CM | POA: Diagnosis not present

## 2022-09-28 DIAGNOSIS — H2513 Age-related nuclear cataract, bilateral: Secondary | ICD-10-CM | POA: Diagnosis not present

## 2022-10-25 ENCOUNTER — Other Ambulatory Visit: Payer: Self-pay | Admitting: Family Medicine

## 2022-10-25 DIAGNOSIS — M85859 Other specified disorders of bone density and structure, unspecified thigh: Secondary | ICD-10-CM

## 2023-02-01 ENCOUNTER — Encounter (HOSPITAL_BASED_OUTPATIENT_CLINIC_OR_DEPARTMENT_OTHER): Payer: Self-pay | Admitting: Cardiology

## 2023-02-01 ENCOUNTER — Ambulatory Visit (HOSPITAL_BASED_OUTPATIENT_CLINIC_OR_DEPARTMENT_OTHER): Payer: Medicare PPO | Admitting: Cardiology

## 2023-02-01 VITALS — BP 138/80 | HR 73 | Ht 62.0 in | Wt 150.8 lb

## 2023-02-01 DIAGNOSIS — I493 Ventricular premature depolarization: Secondary | ICD-10-CM | POA: Diagnosis not present

## 2023-02-01 DIAGNOSIS — I361 Nonrheumatic tricuspid (valve) insufficiency: Secondary | ICD-10-CM | POA: Diagnosis not present

## 2023-02-01 DIAGNOSIS — I340 Nonrheumatic mitral (valve) insufficiency: Secondary | ICD-10-CM | POA: Diagnosis not present

## 2023-02-01 DIAGNOSIS — R011 Cardiac murmur, unspecified: Secondary | ICD-10-CM
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
## 2023-02-01 NOTE — Patient Instructions (Signed)
Medication Instructions:  Your physician recommends that you continue on your current medications as directed. Please refer to the Current Medication list given to you today.  *If you need a refill on your cardiac medications before your next appointment, please call your pharmacy*  Lab Work: NONE  Testing/Procedures: NONE  Follow-Up: At [facility], you and your health needs are our priority.  As part of our continuing mission to provide you with exceptional heart care, we have created designated Provider Care Teams.  These Care Teams include your primary Cardiologist (physician) and Advanced Practice Providers (APPs -  Physician Assistants and Nurse Practitioners) who all work together to provide you with the care you need, when you need it.  We recommend signing up for the patient portal called "MyChart".  Sign up information is provided on this After Visit Summary.  MyChart is used to connect with patients for Virtual Visits (Telemedicine).  Patients are able to view lab/test results, encounter notes, upcoming appointments, etc.  Non-urgent messages can be sent to your provider as well.   To learn more about what you can do with MyChart, go to [email or web address].    Your next appointment:   12 month(s)  The format for your next appointment:   In Person  Provider:   Bridgette Christopher, MD     

## 2023-02-03 ENCOUNTER — Other Ambulatory Visit: Payer: Self-pay | Admitting: Family Medicine

## 2023-02-03 DIAGNOSIS — M85859 Other specified disorders of bone density and structure, unspecified thigh: Secondary | ICD-10-CM

## 2023-05-19 NOTE — Progress Notes (Unsigned)
[facility] [address], [address] [place], [place] [address] [phone]  Date:  05/20/2023   Name:  Marilyn Hess   DOB:  1946/10/01   MRN:  [id number]  PCP:  Pearline Cables, MD    Chief Complaint: No chief complaint on file.   History of Present Illness:  Marilyn Hess is a 77 y.o. very pleasant female patient who presents with the following:  Pt seen today for a CPE Last seen by myself about one year ago - history of hyperlipidemia, frequent PVCs, rosacea, osteopenia with increased hip fracture risk on antiresorptive therapy, high PVC burden, follows up with cardiology   Her only current prescription is Fosamax, she also takes calcium and vitamin D  Seen by Cardiology- Dr Cristal Deer in April  Palpitations, bigeminy/trigeminy, high PVC burden:  -Monitor shoes PVC burden of 18%, with occasional bigeminy and trigeminy. One episode of SVT as well.  -Echo without clear structural abnormality. -nuclear treadmill without evidence of ischemia -have discussed both beta blockers and calcium channels in the past. She would like to continue without medications as long as possible -only notices with significant activity, not limiting -we have discussed red flags that need immediate medical attention Intermittent elevated blood pressure readings: improved today after resting/recheck -continue to monitor, has not required medication  Murmur:  -MR/TR on echo, not severe CV risk counseling and prevention:  Shingrix Recommend covid booster and flu shot this fall Colon done last year  Mammo due in August  Dexa last year     Patient Active Problem List   Diagnosis Date Noted   Nonrheumatic tricuspid valve regurgitation 01/01/2022   Nonrheumatic mitral valve regurgitation 01/01/2022   Prediabetes 07/24/2019   Vitamin D deficiency 07/24/2019   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   Murmur, cardiac 07/23/2018   PVC  (premature ventricular contraction) 07/23/2018   Hyperlipidemia 02/07/2015   Overweight (BMI 25.0-29.9) 08/05/2014   Osteopenia 07/10/2012   ROSACEA 12/22/2010   CARPAL TUNNEL SYNDROME 10/20/2007   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   LACTOSE INTOLERANCE 01/16/2007   BREAST MASS, BENIGN 01/16/2007    Past Medical History:  Diagnosis Date   Breast lump    Heart murmur    Hyperlipidemia    PVC's (premature ventricular contractions)     Past Surgical History:  Procedure Laterality Date   CARPAL TUNNEL RELEASE  11/22/2014   COLONOSCOPY  2016   FOOT FRACTURE SURGERY     TOOTH EXTRACTION  07/23/2011    Social History   Tobacco Use   Smoking status: Never   Smokeless tobacco: Never  Substance Use Topics   Alcohol use: No    Alcohol/week: 0.0 standard drinks of alcohol   Drug use: No    Family History  Adopted: Yes  Problem Relation Age of Onset   Alzheimer's disease Mother    Stroke Father    Alcohol abuse Father    Hypertension Father    Hypertension Brother    Colon cancer Neg Hx    Colon polyps Neg Hx    Esophageal cancer Neg Hx    Stomach cancer Neg Hx    Rectal cancer Neg Hx     Allergies  Allergen Reactions   Cinnamon     Headache, breathing problem to long exposure    Medication list has been reviewed and updated.  Current Outpatient Medications on File Prior to Visit  Medication Sig Dispense Refill   alendronate (FOSAMAX) 70 MG tablet  TAKE 1 TABLET BY MOUTH ONCE A WEEK ON AN EMPTY STOMACH WITH  A  FULL  GLASS  OF  WATER 12 tablet 1   calcium-vitamin D (OSCAL WITH D) 500-200 MG-UNIT tablet Take 1 tablet by mouth.     No current facility-administered medications on file prior to visit.    Review of Systems:  As per HPI- otherwise negative.   Physical Examination: There were no vitals filed for this visit. There were no vitals filed for this visit. There is no height or weight on file to calculate BMI. Ideal Body Weight:    GEN: no acute  distress. HEENT: Atraumatic, Normocephalic.  Ears and Nose: No external deformity. CV: RRR, No M/G/R. No JVD. No thrill. No extra heart sounds. PULM: CTA B, no wheezes, crackles, rhonchi. No retractions. No resp. distress. No accessory muscle use. ABD: S, NT, ND, +BS. No rebound. No HSM. EXTR: No c/c/e PSYCH: Normally interactive. Conversant.    Assessment and Plan: *** Physical exam today- encouraged healthy diet and exercise routine Will plan further follow- up pending labs.  Signed Abbe Amsterdam, MD

## 2023-05-19 NOTE — Patient Instructions (Incomplete)
Great to see you again today! Recommend covid booster and flu shot this fall Recommend shingles series if not done yet  Mammogram can be done in late August - can stop by and schedule today  Let me know if you develop any more concerns about your memory- there is medication we can start if you like.

## 2023-05-20 ENCOUNTER — Other Ambulatory Visit (HOSPITAL_BASED_OUTPATIENT_CLINIC_OR_DEPARTMENT_OTHER): Payer: Self-pay | Admitting: Family Medicine

## 2023-05-20 ENCOUNTER — Ambulatory Visit (INDEPENDENT_AMBULATORY_CARE_PROVIDER_SITE_OTHER): Payer: Medicare PPO | Admitting: Family Medicine

## 2023-05-20 ENCOUNTER — Encounter: Payer: Self-pay | Admitting: Family Medicine

## 2023-05-20 VITALS — BP 114/72 | HR 71 | Temp 97.9°F | Resp 18 | Ht 62.0 in | Wt 145.6 lb

## 2023-05-20 DIAGNOSIS — E559 Vitamin D deficiency, unspecified: Secondary | ICD-10-CM | POA: Diagnosis not present

## 2023-05-20 DIAGNOSIS — Z131 Encounter for screening for diabetes mellitus: Secondary | ICD-10-CM | POA: Diagnosis not present

## 2023-05-20 DIAGNOSIS — Z1231 Encounter for screening mammogram for malignant neoplasm of breast: Secondary | ICD-10-CM

## 2023-05-20 DIAGNOSIS — Z130 Encounter for screening for diseases of the blood and blood-forming organs and certain disorders involving the immune mechanism: Secondary | ICD-10-CM | POA: Diagnosis not present

## 2023-05-20 DIAGNOSIS — Z1329 Encounter for screening for other suspected endocrine disorder: Secondary | ICD-10-CM | POA: Diagnosis not present

## 2023-05-20 DIAGNOSIS — I493 Ventricular premature depolarization: Secondary | ICD-10-CM | POA: Diagnosis not present

## 2023-05-20 DIAGNOSIS — M8580 Other specified disorders of bone density and structure, unspecified site: Secondary | ICD-10-CM | POA: Diagnosis not present

## 2023-05-20 DIAGNOSIS — Z0000 Encounter for general adult medical examination without abnormal findings: Secondary | ICD-10-CM | POA: Diagnosis not present

## 2023-05-20 DIAGNOSIS — E785 Hyperlipidemia, unspecified: Secondary | ICD-10-CM | POA: Diagnosis not present

## 2023-05-20 DIAGNOSIS — R413 Other amnesia: Secondary | ICD-10-CM | POA: Diagnosis not present

## 2023-05-20 DIAGNOSIS — E538 Deficiency of other specified B group vitamins: Secondary | ICD-10-CM

## 2023-05-20 LAB — COMPREHENSIVE METABOLIC PANEL
ALT: 12 U/L (ref 0–35)
AST: 17 U/L (ref 0–37)
Albumin: 3.9 g/dL (ref 3.5–5.2)
Alkaline Phosphatase: 44 U/L (ref 39–117)
BUN: 18 mg/dL (ref 6–23)
CO2: 28 mEq/L (ref 19–32)
Calcium: 8.7 mg/dL (ref 8.4–10.5)
Chloride: 104 mEq/L (ref 96–112)
Creatinine, Ser: 0.88 mg/dL (ref 0.40–1.20)
GFR: 64.54 mL/min (ref >60.00)
Glucose, Bld: 97 mg/dL (ref 70–99)
Potassium: 4.3 mEq/L (ref 3.5–5.1)
Sodium: 139 mEq/L (ref 135–145)
Total Bilirubin: 0.5 mg/dL (ref 0.2–1.2)
Total Protein: 5.8 g/dL — ABNORMAL LOW (ref 6.0–8.3)

## 2023-05-20 LAB — HEMOGLOBIN A1C: Hgb A1c MFr Bld: 6 % (ref 4.6–6.5)

## 2023-05-20 LAB — CBC
HCT: 39.4 % (ref 36.0–46.0)
Hemoglobin: 12.6 g/dL (ref 12.0–15.0)
MCHC: 32 g/dL (ref 30.0–36.0)
MCV: 92.7 fl (ref 78.0–100.0)
Platelets: 267 10*3/uL (ref 150.0–400.0)
RBC: 4.24 Mil/uL (ref 3.87–5.11)
RDW: 12.6 % (ref 11.5–15.5)
WBC: 5.2 10*3/uL (ref 4.0–10.5)

## 2023-05-20 LAB — LIPID PANEL
Cholesterol: 237 mg/dL — ABNORMAL HIGH (ref 0–200)
HDL: 81.4 mg/dL (ref >39.00)
LDL Cholesterol: 145 mg/dL — ABNORMAL HIGH (ref 0–99)
NonHDL: 155.19
Total CHOL/HDL Ratio: 3
Triglycerides: 50 mg/dL (ref 0.0–149.0)
VLDL: 10 mg/dL (ref 0.0–40.0)

## 2023-05-20 LAB — VITAMIN D 25 HYDROXY (VIT D DEFICIENCY, FRACTURES): VITD: 17.84 ng/mL — ABNORMAL LOW (ref 30.00–100.00)

## 2023-05-20 LAB — TSH: TSH: 3.05 u[IU]/mL (ref 0.35–5.50)

## 2023-05-20 LAB — VITAMIN B12: Vitamin B-12: 159 pg/mL — ABNORMAL LOW (ref 211–911)

## 2023-05-20 MED ORDER — VITAMIN D3 1.25 MG (50000 UT) PO CAPS: ORAL_CAPSULE | ORAL | 0 refills | Status: DC

## 2023-06-17 ENCOUNTER — Inpatient Hospital Stay (HOSPITAL_BASED_OUTPATIENT_CLINIC_OR_DEPARTMENT_OTHER): Admission: RE | Admit: 2023-06-17 | Payer: Medicare PPO | Source: Ambulatory Visit

## 2023-07-01 ENCOUNTER — Ambulatory Visit (HOSPITAL_BASED_OUTPATIENT_CLINIC_OR_DEPARTMENT_OTHER)
Admission: RE | Admit: 2023-07-01 | Discharge: 2023-07-01 | Disposition: A | Discharge status: Home / Self Care | Payer: Medicare PPO | Source: Ambulatory Visit | Attending: Family Medicine | Admitting: Family Medicine

## 2023-07-01 ENCOUNTER — Encounter (HOSPITAL_BASED_OUTPATIENT_CLINIC_OR_DEPARTMENT_OTHER): Payer: Self-pay

## 2023-07-01 DIAGNOSIS — Z1231 Encounter for screening mammogram for malignant neoplasm of breast: Secondary | ICD-10-CM | POA: Insufficient documentation

## 2023-07-03 ENCOUNTER — Ambulatory Visit (INDEPENDENT_AMBULATORY_CARE_PROVIDER_SITE_OTHER): Payer: Medicare PPO | Admitting: *Deleted

## 2023-07-03 DIAGNOSIS — Z0000 Encounter for general adult medical examination without abnormal findings: Secondary | ICD-10-CM | POA: Diagnosis not present

## 2023-07-03 NOTE — Progress Notes (Signed)
Subjective:   Marilyn Hess is a 77 y.o. female who presents for Medicare Annual (Subsequent) preventive examination.  Visit Complete: Virtual  I connected with  Marilyn Hess on 07/03/23 by a audio enabled telemedicine application and verified that I am speaking with the correct person using two identifiers.  Patient Location: Home  Provider Location: Office/Clinic  I discussed the limitations of evaluation and management by telemedicine. The patient expressed understanding and agreed to proceed.   Review of Systems     Cardiac Risk Factors include: advanced age (>48men, >17 women);dyslipidemia     Objective:    Vital Signs: Unable to obtain new vitals due to this being a telehealth visit.      07/03/2023    2:04 PM 06/28/2022    8:35 AM 04/20/2021    9:13 AM 04/14/2020    9:41 AM 04/13/2019   10:11 AM 04/11/2018   11:09 AM 04/08/2017    9:34 AM  Advanced Directives  Does Patient Have a Medical Advance Directive? No No Yes No No No No  Type of Surveyor, minerals;Living will      Does patient want to make changes to medical advance directive?    No - Patient declined     Copy of Healthcare Power of Attorney in Chart?   No - copy requested      Would patient like information on creating a medical advance directive? No - Patient declined No - Patient declined   No - Patient declined Yes (MAU/Ambulatory/Procedural Areas - Information given) Yes (MAU/Ambulatory/Procedural Areas - Information given)    Current Medications (verified) Outpatient Encounter Medications as of 07/03/2023  Medication Sig   alendronate (FOSAMAX) 70 MG tablet TAKE 1 TABLET BY MOUTH ONCE A WEEK ON AN EMPTY STOMACH WITH  A  FULL  GLASS  OF  WATER   calcium-vitamin D (OSCAL WITH D) 500-200 MG-UNIT tablet Take 1 tablet by mouth.   Cholecalciferol (VITAMIN D3) 1.25 MG (50000 UT) CAPS Take 1 weekly for 12 weeks   No facility-administered encounter medications on file as of  07/03/2023.    Allergies (verified) Cinnamon   History: Past Medical History:  Diagnosis Date   Breast lump    Heart murmur    Hyperlipidemia    PVC's (premature ventricular contractions)    Past Surgical History:  Procedure Laterality Date   CARPAL TUNNEL RELEASE  11/22/2014   COLONOSCOPY  2016   FOOT FRACTURE SURGERY     TOOTH EXTRACTION  07/23/2011   Family History  Adopted: Yes  Problem Relation Age of Onset   Alzheimer's disease Mother    Stroke Father    Alcohol abuse Father    Hypertension Father    Hypertension Brother    Colon cancer Neg Hx    Colon polyps Neg Hx    Esophageal cancer Neg Hx    Stomach cancer Neg Hx    Rectal cancer Neg Hx    Social History   Socioeconomic History   Marital status: Divorced    Spouse name: Not on file   Number of children: Not on file   Years of education: Not on file   Highest education level: Not on file  Occupational History   Not on file  Tobacco Use   Smoking status: Never   Smokeless tobacco: Never  Substance and Sexual Activity   Alcohol use: No    Alcohol/week: 0.0 standard drinks of alcohol   Drug use: No   Sexual  activity: Never  Other Topics Concern   Not on file  Social History Narrative   Not on file   Social Determinants of Health   Financial Resource Strain: Low Risk  (07/03/2023)   Overall Financial Resource Strain (CARDIA)    Difficulty of Paying Living Expenses: Not hard at all  Food Insecurity: No Food Insecurity (07/03/2023)   Hunger Vital Sign    Worried About Running Out of Food in the Last Year: Never true    Ran Out of Food in the Last Year: Never true  Transportation Needs: No Transportation Needs (07/03/2023)   PRAPARE - Administrator, Civil Service (Medical): No    Lack of Transportation (Non-Medical): No  Physical Activity: Inactive (04/20/2021)   Exercise Vital Sign    Days of Exercise per Week: 0 days    Minutes of Exercise per Session: 0 min  Stress: No Stress  Concern Present (07/03/2023)   [employer] of Occupational Health - Occupational Stress Questionnaire    Feeling of Stress : Only a little  Social Connections: Moderately Integrated (07/03/2023)   Social Connection and Isolation Panel [NHANES]    Frequency of Communication with Friends and Family: More than three times a week    Frequency of Social Gatherings with Friends and Family: Twice a week    Attends Religious Services: More than 4 times per year    Active Member of [employer] or Organizations: Yes    Attends Engineer, structural: More than 4 times per year    Marital Status: Divorced    Tobacco Counseling Counseling given: Not Answered   Clinical Intake:  Pre-visit preparation completed: Yes  Pain : No/denies pain  Nutritional Risks: None Diabetes: No  How often do you need to have someone help you when you read instructions, pamphlets, or other written materials from your doctor or pharmacy?: 1 - Never  Interpreter Needed?: No  Information entered by :: [employer], CMA   Activities of Daily Living    07/03/2023    1:48 PM  In your present state of health, do you have any difficulty performing the following activities:  Hearing? 0  Vision? 0  Difficulty concentrating or making decisions? 0  Walking or climbing stairs? 0  Dressing or bathing? 0  Doing errands, shopping? 0  Preparing Food and eating ? N  Using the Toilet? N  In the past six months, have you accidently leaked urine? Y  Do you have problems with loss of bowel control? N  Managing your Medications? N  Managing your Finances? N  Housekeeping or managing your Housekeeping? N    Patient Care Team: Copland, Gwenlyn Found, MD as PCP - General (Family Medicine) Jodelle Red, MD as PCP - Cardiology (Cardiology) Haverstock, Elvin So, MD as Referring Physician (Dermatology)  Indicate any recent Medical Services you may have received from other than [facility] providers in the past year  (date may be approximate).     Assessment:   This is a routine wellness examination for Marilyn Hess.  Hearing/Vision screen No results found.   Goals Addressed   None    Depression Screen    07/03/2023    2:04 PM 05/20/2023    8:16 AM 06/28/2022    8:38 AM 05/17/2022    8:54 AM 04/20/2021    9:19 AM 04/20/2021    9:11 AM 04/14/2020    9:49 AM  PHQ 2/9 Scores  PHQ - 2 Score 0 0 0 2 1 0 0  PHQ-  9 Score    2       Fall Risk    07/03/2023    1:57 PM 05/20/2023    8:16 AM 06/28/2022    8:38 AM 05/17/2022    8:54 AM 04/20/2021    9:17 AM  Fall Risk   Falls in the past year? 1 0 0 0 0  Comment tripped doing some yard work      Number falls in past yr: 0 0 0 0 0  Injury with Fall? 0 0 0 0 0  Risk for fall due to : No Fall Risks No Fall Risks No Fall Risks    Follow up Falls evaluation completed Falls evaluation completed Falls evaluation completed  Falls prevention discussed    MEDICARE RISK AT HOME: Medicare Risk at Home Any stairs in or around the home?: Yes If so, are there any without handrails?: No Home free of loose throw rugs in walkways, pet beds, electrical cords, etc?: Yes Adequate lighting in your home to reduce risk of falls?: Yes Life alert?: No Use of a cane, walker or w/c?: No Grab bars in the bathroom?: No Shower chair or bench in shower?: No Elevated toilet seat or a handicapped toilet?: No  TIMED UP AND GO:  Was the test performed?  No    Cognitive Function:        07/03/2023    2:06 PM 06/28/2022    8:53 AM  6CIT Screen  What Year? 0 points 0 points  What month? 0 points 0 points  What time? 0 points 0 points  Count back from 20 0 points 0 points  Months in reverse 0 points 0 points  Repeat phrase 0 points 0 points  Total Score 0 points 0 points    Immunizations Immunization History  Administered Date(s) Administered   Fluad Quad(high Dose 65+) 07/23/2019   Influenza, High Dose Seasonal PF 07/08/2017, 07/21/2018   Influenza,inj,Quad PF,6+ Mos  08/05/2013, 08/08/2015   PFIZER Comirnaty(Gray Top)Covid-19 Tri-Sucrose Vaccine 01/26/2021   PFIZER(Purple Top)SARS-COV-2 Vaccination 11/30/2019, 12/18/2019, 08/06/2020   Pneumococcal Conjugate-13 08/05/2014   Pneumococcal Polysaccharide-23 08/08/2015   Tdap 06/07/2015   Zoster, Live 02/21/2016    TDAP status: Up to date  Flu Vaccine status: Due, Education has been provided regarding the importance of this vaccine. Advised may receive this vaccine at local pharmacy or Health Dept. Aware to provide a copy of the vaccination record if obtained from local pharmacy or Health Dept. Verbalized acceptance and understanding.  Pneumococcal vaccine status: Up to date  Covid-19 vaccine status: Information provided on how to obtain vaccines.   Qualifies for Shingles Vaccine? Yes   Zostavax completed Yes   Shingrix Completed?: No.    Education has been provided regarding the importance of this vaccine. Patient has been advised to call insurance company to determine out of pocket expense if they have not yet received this vaccine. Advised may also receive vaccine at local pharmacy or Health Dept. Verbalized acceptance and understanding.  Screening Tests Health Maintenance  Topic Date Due   Zoster Vaccines- Shingrix (1 of 2) 07/12/1998   INFLUENZA VACCINE  05/23/2023   COVID-19 Vaccine (5 - 2023-24 season) 06/23/2023   Medicare Annual Wellness (AWV)  06/29/2023   MAMMOGRAM  06/30/2024   DTaP/Tdap/Td (2 - Td or Tdap) 06/06/2025   Pneumonia Vaccine 79+ Years old  Completed   DEXA SCAN  Completed   Hepatitis C Screening  Completed   HPV VACCINES  Aged Out   Colonoscopy  Discontinued  Health Maintenance  Health Maintenance Due  Topic Date Due   Zoster Vaccines- Shingrix (1 of 2) 07/12/1998   INFLUENZA VACCINE  05/23/2023   COVID-19 Vaccine (5 - 2023-24 season) 06/23/2023   Medicare Annual Wellness (AWV)  06/29/2023    Colorectal cancer screening: No longer required.   Mammogram  status: Completed 07/01/23. Repeat every year  Bone Density status: Completed 06/12/22. Results reflect: Bone density results: OSTEOPENIA. Repeat every 2 years.  Lung Cancer Screening: (Low Dose CT Chest recommended if Age 70-80 years, 20 pack-year currently smoking OR have quit w/in 15years.) does not qualify.   Additional Screening:  Hepatitis C Screening: does qualify; Completed 07/08/17  Vision Screening: Recommended annual ophthalmology exams for early detection of glaucoma and other disorders of the eye. Is the patient up to date with their annual eye exam?  Yes  Who is the provider or what is the name of the office in which the patient attends annual eye exams? [facility] If pt is not established with a provider, would they like to be referred to a provider to establish care? No .   Dental Screening: Recommended annual dental exams for proper oral hygiene  Diabetic Foot Exam: N/a  Community Resource Referral / Chronic Care Management: CRR required this visit?  No   CCM required this visit?  No     Plan:     I have personally reviewed and noted the following in the patient's chart:   Medical and social history Use of alcohol, tobacco or illicit drugs  Current medications and supplements including opioid prescriptions. Patient is not currently taking opioid prescriptions. Functional ability and status Nutritional status Physical activity Advanced directives List of other physicians Hospitalizations, surgeries, and ER visits in previous 12 months Vitals Screenings to include cognitive, depression, and falls Referrals and appointments  In addition, I have reviewed and discussed with patient certain preventive protocols, quality metrics, and best practice recommendations. A written personalized care plan for preventive services as well as general preventive health recommendations were provided to patient.     Donne Anon, CMA   07/03/2023   After Visit Summary:  (MyChart) Due to this being a telephonic visit, the after visit summary with patients personalized plan was offered to patient via MyChart   Nurse Notes: None

## 2023-07-03 NOTE — Patient Instructions (Signed)
Marilyn Hess , Thank you for taking time to come for your Medicare Wellness Visit. I appreciate your ongoing commitment to your health goals. Please review the following plan we discussed and let me know if I can assist you in the future.     This is a list of the screening recommended for you and due dates:  Health Maintenance  Topic Date Due   Zoster (Shingles) Vaccine (1 of 2) 07/12/1998   Flu Shot  05/23/2023   COVID-19 Vaccine (5 - 2023-24 season) 06/23/2023   Mammogram  06/30/2024   Medicare Annual Wellness Visit  07/02/2024   DTaP/Tdap/Td vaccine (2 - Td or Tdap) 06/06/2025   Pneumonia Vaccine  Completed   DEXA scan (bone density measurement)  Completed   Hepatitis C Screening  Completed   HPV Vaccine  Aged Out   Colon Cancer Screening  Discontinued     Next appointment: Follow up in one year for your annual wellness visit.   Preventive Care 75 Years and Older, Female Preventive care refers to lifestyle choices and visits with your health care provider that can promote health and wellness. What does preventive care include? A yearly physical exam. This is also called an annual well check. Dental exams once or twice a year. Routine eye exams. Ask your health care provider how often you should have your eyes checked. Personal lifestyle choices, including: Daily care of your teeth and gums. Regular physical activity. Eating a healthy diet. Avoiding tobacco and drug use. Limiting alcohol use. Practicing safe sex. Taking low-dose aspirin every day. Taking vitamin and mineral supplements as recommended by your health care provider. What happens during an annual well check? The services and screenings done by your health care provider during your annual well check will depend on your age, overall health, lifestyle risk factors, and family history of disease. Counseling  Your health care provider may ask you questions about your: Alcohol use. Tobacco use. Drug  use. Emotional well-being. Home and relationship well-being. Sexual activity. Eating habits. History of falls. Memory and ability to understand (cognition). Work and work Astronomer. Reproductive health. Screening  You may have the following tests or measurements: Height, weight, and BMI. Blood pressure. Lipid and cholesterol levels. These may be checked every 5 years, or more frequently if you are over 50 years old. Skin check. Lung cancer screening. You may have this screening every year starting at age 75 if you have a 30-pack-year history of smoking and currently smoke or have quit within the past 15 years. Fecal occult blood test (FOBT) of the stool. You may have this test every year starting at age 75. Flexible sigmoidoscopy or colonoscopy. You may have a sigmoidoscopy every 5 years or a colonoscopy every 10 years starting at age 75. Hepatitis C blood test. Hepatitis B blood test. Sexually transmitted disease (STD) testing. Diabetes screening. This is done by checking your blood sugar (glucose) after you have not eaten for a while (fasting). You may have this done every 1-3 years. Bone density scan. This is done to screen for osteoporosis. You may have this done starting at age 75. Mammogram. This may be done every 1-2 years. Talk to your health care provider about how often you should have regular mammograms. Talk with your health care provider about your test results, treatment options, and if necessary, the need for more tests. Vaccines  Your health care provider may recommend certain vaccines, such as: Influenza vaccine. This is recommended every year. Tetanus, diphtheria, and acellular pertussis (  Tdap, Td) vaccine. You may need a Td booster every 10 years. Zoster vaccine. You may need this after age 75. Pneumococcal 13-valent conjugate (PCV13) vaccine. One dose is recommended after age 75. Pneumococcal polysaccharide (PPSV23) vaccine. One dose is recommended after age  75. Talk to your health care provider about which screenings and vaccines you need and how often you need them. This information is not intended to replace advice given to you by your health care provider. Make sure you discuss any questions you have with your health care provider. Document Released: 11/04/2015 Document Revised: 06/27/2016 Document Reviewed: 08/09/2015 Elsevier Interactive Patient Education  2017 [employer].  Fall Prevention in the Home Falls can cause injuries. They can happen to people of all ages. There are many things you can do to make your home safe and to help prevent falls. What can I do on the outside of my home? Regularly fix the edges of walkways and driveways and fix any cracks. Remove anything that might make you trip as you walk through a door, such as a raised step or threshold. Trim any bushes or trees on the path to your home. Use bright outdoor lighting. Clear any walking paths of anything that might make someone trip, such as rocks or tools. Regularly check to see if handrails are loose or broken. Make sure that both sides of any steps have handrails. Any raised decks and porches should have guardrails on the edges. Have any leaves, snow, or ice cleared regularly. Use sand or salt on walking paths during winter. Clean up any spills in your garage right away. This includes oil or grease spills. What can I do in the bathroom? Use night lights. Install grab bars by the toilet and in the tub and shower. Do not use towel bars as grab bars. Use non-skid mats or decals in the tub or shower. If you need to sit down in the shower, use a plastic, non-slip stool. Keep the floor dry. Clean up any water that spills on the floor as soon as it happens. Remove soap buildup in the tub or shower regularly. Attach bath mats securely with double-sided non-slip rug tape. Do not have throw rugs and other things on the floor that can make you trip. What can I do in the  bedroom? Use night lights. Make sure that you have a light by your bed that is easy to reach. Do not use any sheets or blankets that are too big for your bed. They should not hang down onto the floor. Have a firm chair that has side arms. You can use this for support while you get dressed. Do not have throw rugs and other things on the floor that can make you trip. What can I do in the kitchen? Clean up any spills right away. Avoid walking on wet floors. Keep items that you use a lot in easy-to-reach places. If you need to reach something above you, use a strong step stool that has a grab bar. Keep electrical cords out of the way. Do not use floor polish or wax that makes floors slippery. If you must use wax, use non-skid floor wax. Do not have throw rugs and other things on the floor that can make you trip. What can I do with my stairs? Do not leave any items on the stairs. Make sure that there are handrails on both sides of the stairs and use them. Fix handrails that are broken or loose. Make sure that handrails are  as long as the stairways. Check any carpeting to make sure that it is firmly attached to the stairs. Fix any carpet that is loose or worn. Avoid having throw rugs at the top or bottom of the stairs. If you do have throw rugs, attach them to the floor with carpet tape. Make sure that you have a light switch at the top of the stairs and the bottom of the stairs. If you do not have them, ask someone to add them for you. What else can I do to help prevent falls? Wear shoes that: Do not have high heels. Have rubber bottoms. Are comfortable and fit you well. Are closed at the toe. Do not wear sandals. If you use a stepladder: Make sure that it is fully opened. Do not climb a closed stepladder. Make sure that both sides of the stepladder are locked into place. Ask someone to hold it for you, if possible. Clearly mark and make sure that you can see: Any grab bars or  handrails. First and last steps. Where the edge of each step is. Use tools that help you move around (mobility aids) if they are needed. These include: Canes. Walkers. Scooters. Crutches. Turn on the lights when you go into a dark area. Replace any light bulbs as soon as they burn out. Set up your furniture so you have a clear path. Avoid moving your furniture around. If any of your floors are uneven, fix them. If there are any pets around you, be aware of where they are. Review your medicines with your doctor. Some medicines can make you feel dizzy. This can increase your chance of falling. Ask your doctor what other things that you can do to help prevent falls. This information is not intended to replace advice given to you by your health care provider. Make sure you discuss any questions you have with your health care provider. Document Released: 08/04/2009 Document Revised: 03/15/2016 Document Reviewed: 11/12/2014 Elsevier Interactive Patient Education  2017 [employer].

## 2023-08-23 ENCOUNTER — Other Ambulatory Visit: Payer: Self-pay | Admitting: Family Medicine

## 2023-08-23 DIAGNOSIS — M85859 Other specified disorders of bone density and structure, unspecified thigh: Secondary | ICD-10-CM

## 2023-09-10 ENCOUNTER — Other Ambulatory Visit: Payer: Self-pay | Admitting: Family Medicine

## 2023-09-10 DIAGNOSIS — M85859 Other specified disorders of bone density and structure, unspecified thigh: Secondary | ICD-10-CM

## 2023-09-24 ENCOUNTER — Telehealth: Payer: Self-pay | Admitting: Family Medicine

## 2023-09-24 ENCOUNTER — Other Ambulatory Visit: Payer: Self-pay

## 2023-09-24 DIAGNOSIS — M85859 Other specified disorders of bone density and structure, unspecified thigh: Secondary | ICD-10-CM

## 2023-09-24 MED ORDER — ALENDRONATE SODIUM 70 MG PO TABS: 70.0000 mg | ORAL_TABLET | ORAL | 0 refills | Status: DC

## 2023-09-24 NOTE — Telephone Encounter (Signed)
Patient called and needs a med refill on     alendronate (FOSAMAX) 70 MG tablet  Please send to [facility] on [address].

## 2023-09-24 NOTE — Telephone Encounter (Signed)
Refill has been sent.  °

## 2023-10-11 DIAGNOSIS — E119 Type 2 diabetes mellitus without complications: Secondary | ICD-10-CM | POA: Diagnosis not present

## 2023-11-25 ENCOUNTER — Ambulatory Visit: Payer: Self-pay | Admitting: Family Medicine

## 2023-11-25 NOTE — Telephone Encounter (Signed)
  Chief Complaint: Allergic reaction- now resolved Symptoms: tingling in mouth and lips Frequency: 2x over the last two days Pertinent Negatives: Patient denies any symptoms at this time Disposition: [] ED /[] Urgent Care (no appt availability in office) / [x] Appointment(In office/virtual)/ []  [place] [facility]/ [] Home Care/ [] Refused Recommended Disposition /[] [place] [facility]/ []  Follow-up with PCP Additional Notes: Patient calls reporting new reaction to peanut butter. States she is asymptomatic at this time - events occurred twice 2 days ago- but is concerned she may have developed a new allergy. She is requesting next available with PCP to discuss concerns. Scheduled for 11/27/23 at 1120. This RN educated patient on avoiding potential allergen and being mindful of the foods she is eating or coming into contact with. Advised patient if these symptoms occur again to call 911 if needed. Patient verbalized understanding. Alerting PCP for review.   Copied from CRM [id number]. Topic: Clinical - Medical Advice >> Nov 25, 2023 10:41 AM Orinda Kenner C wrote: Reason for [id number] [phone] started having a sensitivity to peanut butter about a few days ago, symptoms was lips started to tingling, heart racing. Patient denies throat closing up, breathing issues except for being scared. Patient is frighten has cinnamon allergy similar reaction. Wants to do an allergy test to rule out. Patient denies any symptoms at this moment. Patient does not use MyChart, please advise and call back. Reason for Disposition  Requesting regular office appointment  Answer Assessment - Initial Assessment Questions 1. REASON FOR CALL or QUESTION: "What is your reason for calling today?" or "How can I best help you?" or "What question do you have that I can help answer?"     Patient calls reporting the last two times she has had peanut butter she began to feel tingling in her mouth. States it felt very similar to how she  feels when she has an allergic reaction to cinnamon. States as soon as she took a bite, she immediately felt tingling in her mouth and lips, so she spit out the peanut butter and began rinsing her mouth immediately. States her symptoms resolved after. Patient is requesting next available with PCP to discuss possible new allergy to peanuts.  Protocols used: Information Only Call - No Triage-A-AH

## 2023-11-26 NOTE — Progress Notes (Addendum)
 [facility] [address], [address] [place], [place] [address] [id number] [phone]  Date:  11/27/2023   Name:  Marilyn Hess   DOB:  [birth year]   MRN:  [id number]  PCP:  Watt Harlene BROCKS, MD    Chief Complaint: allergic reation (Monday 11/25/23 pt says she ate an apple w/peanut  butter and her lips started to tingle. So she is sure she is developing an allergy  to peanut  butter. /Concerns/ questions: pt injured her L arm 2-3 days ago and has limited ROM)   History of Present Illness:  Marilyn Hess is a 78 y.o. very pleasant female patient who presents with the following:  Patient seen today with concern of possible new food allergy  Most recent visit with myself was in July for her physical- history of hyperlipidemia, frequent PVCs, rosacea, osteopenia with increased hip fracture risk on antiresorptive therapy, high PVC burden, follows up with cardiology   She contacted us  earlier this week with a concern about possible peanut  butter allergy  symptoms which are new Pt notes she consumes peanut  butter most days 2 day ago she ate her peanut  butter as usual and also ate some mixed nuts About 20 minutes later her lips were tingling She put a wet cloth on her lips and it seemed to get better after 10- 20 minutes No wheezing or rash No swelling of her mouth or lip  She tried PB again yesterday and had the same reaction Today she feels ok  -No symptoms, she avoided peanut  butter today 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  She is working on cleaning things out and knows that she needs to address this.  She is interested in seeing a counselor  Finally, she  notes she tripped on her porch stairs and caught herself with her left arm 3 to 4 days ago.  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   LACTOSE INTOLERANCE 01/16/2007   BREAST MASS, BENIGN 01/16/2007    Past Medical History:  Diagnosis Date   Breast lump    Heart murmur    Hyperlipidemia    PVC's (premature ventricular contractions)     Past Surgical History:  Procedure Laterality Date   CARPAL TUNNEL RELEASE  11/22/2014   COLONOSCOPY  2016   FOOT FRACTURE SURGERY     TOOTH EXTRACTION  07/23/2011    Social History   Tobacco Use   Smoking status: Never   Smokeless tobacco: Never  Substance Use Topics   Alcohol use: No    Alcohol/week: 0.0 standard drinks of alcohol   Drug use: No    Family History  Adopted: Yes  Problem Relation Age of Onset   Alzheimer's disease Mother    Stroke Father    Alcohol abuse Father    Hypertension Father    Hypertension Brother    Colon cancer Neg Hx    Colon polyps Neg Hx    Esophageal cancer Neg Hx    Stomach cancer Neg Hx    Rectal cancer Neg Hx     Allergies  Allergen Reactions   Cinnamon     Headache, breathing problem to long exposure    Medication list has been reviewed and updated.  No current outpatient medications on file prior to visit.   No current facility-administered medications on file prior to visit.    Review of Systems:  As per HPI- otherwise  negative.   Physical Examination: Vitals:   11/27/23 1102  BP: 130/84  Pulse: 76  Resp: 18  Temp: 97.6 F (36.4 C)  SpO2: 97%   Vitals:   11/27/23 1102  Weight: 145 lb (65.8 kg)  Height: 5' 1.5 (1.562 m)   Body mass index is 26.95 kg/m. Ideal Body Weight: Weight in (lb) to have BMI = 25: 134.2  GEN: no acute distress.  Minimal overweight, looks well HEENT: Atraumatic, Normocephalic.  Bilateral TM wnl, oropharynx normal.  PEERL,EOMI.   Ears and Nose: No external deformity. CV: RRR, No M/G/R. No JVD. No thrill. No extra heart sounds. PULM: CTA B, no wheezes, crackles, rhonchi. No retractions. No resp. distress. No accessory muscle use. ABD: S, NT, ND, +BS. No rebound. No HSM. EXTR: No c/c/e PSYCH: Normally interactive. Conversant.  Tender at rotator cuff insertion left humerus Elbow, wrist negative  Assessment and Plan: Nut allergy  - Plan: IgE Peanut  w/Component Reflex, EPINEPHrine  (EPIPEN  2-PAK) 0.3 mg/0.3 mL IJ SOAJ injection, CBC, Basic metabolic panel  Vitamin D  deficiency - Plan: VITAMIN D  25 Hydroxy (Vit-D Deficiency, Fractures)  B12 deficiency - Plan: B12  Osteopenia of hip, unspecified laterality - Plan: alendronate  (FOSAMAX ) 70 MG tablet  Allergic reaction, initial encounter - Plan: CBC, Basic metabolic panel  Pain in left arm - Plan: DG Humerus Left  Patient seen today with concern of new onset allergy  to peanuts or other nuts. Peanut  IgE panel drawn today, provided EpiPen  prescription.  Encouraged her to become familiar with EpiPen  use, if she does need EpiPen  also call 911.  Discussed use of Benadryl for milder allergic symptoms  Follow-up on routine blood work today  X-ray of left humerus to rule out fracture-sent her message with results   Signed Harlene Schroeder, MD  No results found.  Addendum 11/28/2023, received labs as below.  Message to patient  Results for orders placed or performed in visit on 11/27/23  IgE Peanut  w/Component Reflex    Collection Time: 11/27/23  2:14 PM  Result Value Ref Range   Class Description Allergens Comment    Peanut , IgE <0.10 Class 0 kU/L  VITAMIN D  25 Hydroxy (Vit-D Deficiency, Fractures)   Collection Time: 11/27/23  2:14 PM  Result Value Ref Range   VITD 28.77 (L) 30.00 - 100.00 ng/mL  CBC   Collection Time: 11/27/23  2:14 PM  Result Value Ref Range   WBC 7.4 4.0 - 10.5 K/uL   RBC 4.51 3.87 - 5.11 Mil/uL   Platelets 320.0 150.0 - 400.0 K/uL   Hemoglobin 14.2 12.0 - 15.0 g/dL   HCT 58.3 63.9 - 53.9 %   MCV 92.4 78.0 - 100.0 fl   MCHC 34.1 30.0 - 36.0 g/dL   RDW 87.8 88.4 -  15.5 %  Basic metabolic panel   Collection Time: 11/27/23  2:14 PM  Result Value Ref Range   Sodium 139 135 - 145 mEq/L   Potassium 4.4 3.5 - 5.1 mEq/L   Chloride 101 96 - 112 mEq/L   CO2 27 19 - 32 mEq/L   Glucose, Bld 137 (H) 70 - 99 mg/dL   BUN 20 6 - 23 mg/dL   Creatinine, Ser 9.08 0.40 - 1.20 mg/dL   GFR 38.22 >39.99 mL/min   Calcium 9.9 8.4 - 10.5 mg/dL  A87   Collection Time: 11/27/23  2:14 PM  Result Value Ref Range   Vitamin B-12 123 (L) 211 - 911 pg/mL   Addendum 2/10, received her peanut  allergy  IgE level Message to patient, we will set her up for formal allergy  testing

## 2023-11-27 ENCOUNTER — Other Ambulatory Visit: Payer: Medicare PPO

## 2023-11-27 ENCOUNTER — Ambulatory Visit: Payer: Medicare PPO | Admitting: Family Medicine

## 2023-11-27 ENCOUNTER — Encounter: Payer: Self-pay | Admitting: Family Medicine

## 2023-11-27 ENCOUNTER — Ambulatory Visit (HOSPITAL_BASED_OUTPATIENT_CLINIC_OR_DEPARTMENT_OTHER)
Admission: RE | Admit: 2023-11-27 | Discharge: 2023-11-27 | Disposition: A | Discharge status: Home / Self Care | Payer: Medicare PPO | Source: Ambulatory Visit | Attending: Family Medicine | Admitting: Family Medicine

## 2023-11-27 VITALS — BP 130/84 | HR 76 | Temp 97.6°F | Resp 18 | Ht 61.5 in | Wt 145.0 lb

## 2023-11-27 DIAGNOSIS — Z91018 Allergy to other foods: Secondary | ICD-10-CM

## 2023-11-27 DIAGNOSIS — E559 Vitamin D deficiency, unspecified: Secondary | ICD-10-CM

## 2023-11-27 DIAGNOSIS — M85859 Other specified disorders of bone density and structure, unspecified thigh: Secondary | ICD-10-CM

## 2023-11-27 DIAGNOSIS — T7840XA Allergy, unspecified, initial encounter: Secondary | ICD-10-CM

## 2023-11-27 DIAGNOSIS — E538 Deficiency of other specified B group vitamins: Secondary | ICD-10-CM

## 2023-11-27 DIAGNOSIS — M79602 Pain in left arm: Secondary | ICD-10-CM

## 2023-11-27 DIAGNOSIS — S4992XA Unspecified injury of left shoulder and upper arm, initial encounter: Secondary | ICD-10-CM | POA: Diagnosis not present

## 2023-11-27 MED ORDER — EPINEPHRINE 0.3 MG/0.3ML IJ SOAJ: 0.3000 mg | INTRAMUSCULAR | 99 refills | Status: AC | PRN

## 2023-11-27 MED ORDER — ALENDRONATE SODIUM 70 MG PO TABS: 70.0000 mg | ORAL_TABLET | ORAL | 3 refills | Status: DC

## 2023-11-27 NOTE — Patient Instructions (Addendum)
 It was good to see you today, I will be in touch with your labs soon as possible. Avoid peanuts and tree nuts for now we will wait on your lab work. Keep benadryl handy and take 25- 50 mg right away if a reaction Keep epi-pen handy in case of emergency- if you use the epipen  also call 911  Good luck with clearing out your clutter- this is a hard job!  You might try setting a timer and working for 30- 45 minutes a day on clean out, or hire someone to help you If you would like to try some counseling here is the contact info for [facility]- they have several locations!    Address: [address], [place], [place] [address] Phone: [phone]

## 2023-11-28 ENCOUNTER — Telehealth: Payer: Self-pay

## 2023-11-28 ENCOUNTER — Encounter: Payer: Self-pay | Admitting: Family Medicine

## 2023-11-28 LAB — BASIC METABOLIC PANEL
BUN: 20 mg/dL (ref 6–23)
CO2: 27 meq/L (ref 19–32)
Calcium: 9.9 mg/dL (ref 8.4–10.5)
Chloride: 101 meq/L (ref 96–112)
Creatinine, Ser: 0.91 mg/dL (ref 0.40–1.20)
GFR: 61.77 mL/min (ref >60.00)
Glucose, Bld: 137 mg/dL — ABNORMAL HIGH (ref 70–99)
Potassium: 4.4 meq/L (ref 3.5–5.1)
Sodium: 139 meq/L (ref 135–145)

## 2023-11-28 LAB — CBC
HCT: 41.6 % (ref 36.0–46.0)
Hemoglobin: 14.2 g/dL (ref 12.0–15.0)
MCHC: 34.1 g/dL (ref 30.0–36.0)
MCV: 92.4 fL (ref 78.0–100.0)
Platelets: 320 10*3/uL (ref 150.0–400.0)
RBC: 4.51 Mil/uL (ref 3.87–5.11)
RDW: 12.1 % (ref 11.5–15.5)
WBC: 7.4 10*3/uL (ref 4.0–10.5)

## 2023-11-28 LAB — VITAMIN D 25 HYDROXY (VIT D DEFICIENCY, FRACTURES): VITD: 28.77 ng/mL — ABNORMAL LOW (ref 30.00–100.00)

## 2023-11-28 LAB — VITAMIN B12: Vitamin B-12: 123 pg/mL — ABNORMAL LOW (ref 211–911)

## 2023-11-28 NOTE — Telephone Encounter (Signed)
 Copied from CRM [id number]. Topic: Clinical - Lab/Test Results >> Nov 28, 2023  3:04 PM Russell PARAS wrote: Reason for CRM: Patient is calling regarding lab results recently performed. Checked chart and results are listed as abnormal and there were no messages in the results. Advised would send message to clinic and would receive a call back to review results. CB# [phone]

## 2023-11-29 ENCOUNTER — Telehealth: Payer: Self-pay

## 2023-11-29 NOTE — Telephone Encounter (Signed)
 Copied from CRM [id number]. Topic: Clinical - Lab/Test Results >> Nov 29, 2023 11:20 AM Lizabeth Riggs wrote: Reason for CRM: [name] wants a nurse to call her about her test results. Please call her at [phone]

## 2023-11-29 NOTE — Telephone Encounter (Signed)
 Discussed with pt

## 2023-11-29 NOTE — Telephone Encounter (Signed)
 Pt was contacted and states that she has not been on any vitamin D  or B12 recently. She asks if these can be sent to her Pharmacy. - should she try B12 oral before injections, she also eats minimal meat.

## 2023-11-30 LAB — IGE PEANUT W/COMPONENT REFLEX: Peanut, IgE: <0.1 kU/L

## 2023-12-02 ENCOUNTER — Encounter: Payer: Self-pay | Admitting: Family Medicine

## 2023-12-02 NOTE — Addendum Note (Signed)
 Addended by: Gates Kasal C on: 12/02/2023 05:40 AM   Modules accepted: Orders

## 2023-12-03 ENCOUNTER — Telehealth: Payer: Self-pay | Admitting: Family Medicine
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
## 2023-12-04 NOTE — Telephone Encounter (Signed)
Hi Marilyn Hess-your peanut allergy test came back. It is actually negative.  However, given your symptoms I am still suspicious that you may have a nut allergy.  If it is okay with you I would like to set you up for formal allergy testing with an allergist.  I will go ahead and place a referral for you.  In the meantime I would like you to continue avoiding peanuts to be on the safe side   Let me know what questions you have JC   Status: Final result     Dx: Nut allergy   Test Result Released: Yes (not seen)        Component Ref Range & Units (hover) 5 d ago  Class Description Allergens Comment  Comment:     Levels of Specific IgE       Class  Description of Class     ---------------------------  -----  --------------------                    < 0.10         0         Negative            0.10 -    0.31         0/I       Equivocal/Low            0.32 -    0.55         I         Low            0.56 -    1.40         II        Moderate            1.41 -    3.90         III       High            3.91 -   19.00         IV        Very High           19.01 -  100.00         V         Very High                   >100.00         VI        Very High  Peanut, IgE <0.10

## 2023-12-05 NOTE — Telephone Encounter (Signed)
Pt aware and voices understanding.  She would like to proceed with allergy referral.

## 2023-12-05 NOTE — Telephone Encounter (Signed)
Ok- referral already in

## 2023-12-18 ENCOUNTER — Encounter: Payer: Self-pay | Admitting: Allergy

## 2023-12-18 ENCOUNTER — Other Ambulatory Visit: Payer: Self-pay

## 2023-12-18 ENCOUNTER — Ambulatory Visit: Payer: Medicare PPO | Admitting: Allergy

## 2023-12-18 VITALS — BP 138/82 | HR 83 | Temp 98.1°F | Resp 18 | Ht 61.5 in | Wt 141.7 lb

## 2023-12-18 DIAGNOSIS — T781XXD Other adverse food reactions, not elsewhere classified, subsequent encounter: Secondary | ICD-10-CM

## 2023-12-18 DIAGNOSIS — J3089 Other allergic rhinitis: Secondary | ICD-10-CM

## 2023-12-18 NOTE — Progress Notes (Signed)
 New Patient Note  RE: Marilyn Hess MRN: [id number] DOB: 01/08/1946 Date of Office Visit: 12/18/2023  Consult requested by: Pearline Cables, MD Primary care provider: Pearline Cables, MD  Chief Complaint: Allergic Reaction (Avoids cinnamon (swelling) and is avoiding all treenuts and peanuts - ate mixed nuts and then next day ate peanut butter it caused lip tingling. Has lost weight in 2 weeks since she started avoiding peanut butter and products that may contain it. )  History of Present Illness: I had the pleasure of seeing Marilyn Hess for initial evaluation at the [facility] on 12/18/2023. She is a 78 y.o. female, who is referred here by Copland, Gwenlyn Found, MD for the evaluation of food allergy.  Discussed the use of AI scribe software for clinical note transcription with the patient, who gave verbal consent to proceed.    Patient had reactions to cinnamon, peanuts, and tree nuts. Symptoms include heart racing, lip and tongue swelling, and tingling sensations. These reactions have been occurring over the past few years, with the most recent cinnamon reaction earlier this year after consuming shortbread cookies. Symptoms resolved within 15 to 20 minutes without medication, but she has since avoided cinnamon to prevent future issues.  Approximately a week and a half ago, she had a reaction to mixed tree nuts, specifically cashews, pecans, and peanuts. This occurred after consuming a handful of mixed nuts, with tingling sensations following shortly after. The next day, after consuming peanut butter with an apple, she experienced similar tingling. She managed these episodes by rinsing her mouth and gargling, and the symptoms subsided within about 10 minutes without medication. She has been avoiding peanuts and tree nuts since then.  She had negative bloodwork to peanuts. She has contacted food companies to inquire about potential cross-contamination, noting that  many products have warnings for possible nut contamination.  She used to consume peanut butter and Nutella regularly but have since stopped due to the reactions.  She reports occasional environmental allergies, such as when raking leaves, but no severe symptoms.     Dietary History: patient has been eating other foods including milk, eggs, sesame, shellfish, fish, soy, wheat, meats, fruits and vegetables.  She reports reading labels and avoiding cinnamon, peanuts, tree nuts in diet completely.   11/27/2023 PCP visit: "She contacted [place] earlier this week with a concern about possible peanut butter allergy symptoms which are new Pt notes she consumes peanut butter most days 2 day ago she ate her peanut butter as usual and also ate some mixed nuts About 20 minutes later her lips were tingling She put a wet cloth on her lips and it seemed to get better after 10- 20 minutes No wheezing or rash No swelling of her mouth or lip   She tried PB again yesterday and had the same reaction Today she feels ok  -No symptoms, she avoided peanut butter today She notes she is allergic to cinnamon- she will get lip tingling with exposure to cinnamon as well  She first noted an issue with cinnamon maybe 10 years ago- she is careful to avoid cinnamon now  Otherwise no allergies "  Assessment and Plan: Marilyn Hess is a 78 y.o. female with: Other adverse food reactions, not elsewhere classified, subsequent encounter Reaction with lip and tongue swelling, heart racing, and tingling in the mouth. Symptoms resolved within 15-20 minutes without medication. Avoidance of cinnamon has been successful in preventing further reactions. Recent reaction with lip tingling after eating  mixed nuts and peanut butter. Patient has been avoiding these foods but used to tolerate peanuts/tree nuts previously with no issues.  Start strict avoidance of peanuts, tree nuts, cinnamon.  Epinephrine injectable device - demonstrated proper  use. For mild symptoms you can take over the counter antihistamines such as Benadryl 1-2 tablets = 25-50mg  and monitor symptoms closely. If symptoms worsen or if you have severe symptoms including breathing issues, throat closure, significant swelling, whole body hives, severe diarrhea and vomiting, lightheadedness then inject epinephrine and seek immediate medical care afterwards. Emergency action plan given. Get bloodwork If negative then will schedule for skin testing next.  Other allergic rhinitis Minimal symptoms. Monitor. Consider environmental testing in future.  Follow up depending on bloodwork results.   No orders of the defined types were placed in this encounter.  Lab Orders         IgE Nut Prof. w/Component Rflx         Allergen, Cinnamon [id number]      Other allergy screening: Asthma: no Rhino conjunctivitis:  Minimal nasal congestion. Medication allergy: no Hymenoptera allergy: no Large localized reactions.  Urticaria: no Eczema:no History of recurrent infections suggestive of immunodeficency: no  Diagnostics: None.   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 Take with full glass of water on empty stomach 12 tablet 3   EPINEPHrine (EPIPEN 2-PAK) 0.3 mg/0.3 mL IJ SOAJ injection Inject 0.3 mg into the muscle as needed for anaphylaxis. 1 each PRN   No current facility-administered medications for this visit.   Allergies: Allergies  Allergen Reactions   Cinnamon     Headache, breathing problem to long exposure   Social History: Social History   Socioeconomic History   Marital status: Divorced    Spouse name: Not on file   Number of children: Not on file   Years of education: Not on file   Highest education level: Not on file  Occupational History   Not on file  Tobacco Use   Smoking status: Never   Smokeless tobacco: Never  Substance and Sexual Activity   Alcohol use: No    Alcohol/week: 0.0 standard drinks of alcohol   Drug use: No   Sexual activity: Never  Other Topics Concern   Not on file  Social History Narrative   Not on file   Social  Drivers of Health   Financial Resource Strain: Low Risk  (07/03/2023)   Overall Financial Resource Strain (CARDIA)    Difficulty of Paying Living Expenses: Not hard at all  Food Insecurity: No Food Insecurity (07/03/2023)   Hunger Vital Sign    Worried About Running Out of Food in the Last Year: Never true    Ran Out of Food in the Last Year: Never true  Transportation Needs: No Transportation Needs (07/03/2023)   PRAPARE - Administrator, Civil Service (Medical): No    Lack of Transportation (Non-Medical): No  Physical Activity: Inactive (04/20/2021)   Exercise Vital Sign    Days of Exercise per Week: 0 days    Minutes of Exercise per  Session: 0 min  Stress: No Stress Concern Present (07/03/2023)   [employer] of Occupational Health - Occupational Stress Questionnaire    Feeling of Stress : Only a little  Social Connections: Moderately Integrated (07/03/2023)   Social Connection and Isolation Panel [NHANES]    Frequency of Communication with Friends and Family: More than three times a week    Frequency of Social Gatherings with Friends and Family: Twice a week    Attends Religious Services: More than 4 times per year    Active Member of [employer] or Organizations: Yes    Attends Engineer, structural: More than 4 times per year    Marital Status: Divorced   Lives in a house. Smoking: denies Occupation: retired  Landscape architect HistorySurveyor, minerals in the house: no Engineer, civil (consulting) in the family room: no Carpet in the bedroom: no Heating:  baseboard heat Cooling: ceiling fan Pet: 1 outdoor cat.   Family History: Family History  Adopted: Yes  Problem Relation Age of Onset   Alzheimer's disease Mother    Stroke Father    Alcohol abuse Father    Hypertension Father    Hypertension Brother    Colon cancer Neg Hx    Colon polyps Neg Hx    Esophageal cancer Neg Hx    Stomach cancer Neg Hx    Rectal cancer Neg Hx    Review of Systems  Constitutional:  Negative for appetite change, chills, fever and unexpected weight change.  HENT:  Negative for congestion and rhinorrhea.   Eyes:  Negative for itching.  Respiratory:  Negative for cough, chest tightness, shortness of breath and wheezing.   Cardiovascular:  Negative for chest pain.  Gastrointestinal:  Negative for abdominal pain.  Genitourinary:  Negative for difficulty urinating.  Skin:  Negative for rash.  Neurological:  Negative for headaches.    Objective: BP 138/82 (BP Location: Left Arm, Patient Position: Sitting, Cuff Size: Normal) Comment: nerves  Pulse 83   Temp 98.1 F (36.7 C) (Temporal)   Resp 18   Ht 5' 1.5" (1.562 m)   Wt 141 lb  11.2 oz (64.3 kg)   SpO2 97%   BMI 26.34 kg/m  Body mass index is 26.34 kg/m. Physical Exam Vitals and nursing note reviewed.  Constitutional:      Appearance: Normal appearance. She is well-developed.  HENT:     Head: Normocephalic and atraumatic.     Right Ear: Tympanic membrane and external ear normal.     Left Ear: Tympanic membrane and external ear normal.     Nose: Nose normal.     Mouth/Throat:     Mouth: Mucous membranes are moist.     Pharynx: Oropharynx is clear.  Eyes:     Conjunctiva/sclera: Conjunctivae normal.  Cardiovascular:     Rate and Rhythm: Normal rate and regular rhythm.     Heart sounds: Normal heart sounds. No murmur heard.    No friction rub. No gallop.  Pulmonary:     Effort: Pulmonary effort is normal.     Breath sounds: Normal breath sounds. No wheezing, rhonchi or rales.  Musculoskeletal:     Cervical back: Neck supple.  Skin:    General: Skin is warm.     Findings: No rash.  Neurological:     Mental Status: She is alert and oriented to person, place, and time.  Psychiatric:        Behavior: Behavior normal.   The plan was reviewed with the patient/family, and all questions/concerned were addressed.  It was my pleasure to see Marilyn Hess today and participate in her care. Please feel free to contact me with any questions or concerns.  Sincerely,  Wyline Mood, DO Allergy & Immunology  [facility] office: [phone] [facility] office: [phone]

## 2023-12-18 NOTE — Patient Instructions (Addendum)
 Food allergies Start strict avoidance of peanuts, tree nuts, cinnamon.  Epinephrine injectable device - demonstrated proper use. For mild symptoms you can take over the counter antihistamines such as Benadryl 1-2 tablets = 25-50mg  and monitor symptoms closely. If symptoms worsen or if you have severe symptoms including breathing issues, throat closure, significant swelling, whole body hives, severe diarrhea and vomiting, lightheadedness then inject epinephrine and seek immediate medical care afterwards. Emergency action plan given. Get bloodwork If negative then will need to do skin testing next. We are ordering labs, so please allow 1-2 weeks for the results to come back. With the newly implemented Cures Act, the labs might be visible to you at the same time that they become visible to me. However, I will not address the results until all of the results are back, so please be patient.  In the meantime, continue recommendations in your patient instructions, including avoidance measures (if applicable), until you hear from me.  Follow up depending on bloodwork results.

## 2023-12-22 ENCOUNTER — Encounter: Payer: Self-pay | Admitting: Allergy

## 2023-12-22 LAB — IGE NUT PROF. W/COMPONENT RFLX
F017-IgE Hazelnut (Filbert): <0.1 kU/L
F018-IgE Brazil Nut: <0.1 kU/L
F020-IgE Almond: <0.1 kU/L
F202-IgE Cashew Nut: <0.1 kU/L
F203-IgE Pistachio Nut: <0.1 kU/L
F256-IgE Walnut: <0.1 kU/L
Macadamia Nut, IgE: <0.1 kU/L
Peanut, IgE: <0.1 kU/L
Pecan Nut IgE: <0.1 kU/L

## 2023-12-22 LAB — ALLERGEN, CINNAMON, RF220

## 2023-12-31 NOTE — Progress Notes (Unsigned)
 Skin testing note  RE: Marilyn Hess MRN: [id number] DOB: 1946/07/10 Date of Office Visit: 01/01/2024  Referring provider: Pearline Cables, MD Primary care provider: Pearline Cables, MD  Chief Complaint: skin testing  History of Present Illness: I had the pleasure of seeing Marilyn Hess for a skin testing visit at the [facility] on 01/01/2024. She is a 78 y.o. female, who is being followed for adverse food reaction and allergic rhinitis. Her previous allergy office visit was on 12/18/2023 with Dr. Selena Batten. Today is a skin testing visit.   Discussed the use of AI scribe software for clinical note transcription with the patient, who gave verbal consent to proceed.    She experiences tingling of the mouth after consuming certain foods, particularly nuts. The sensation is mild and not accompanied by difficulty breathing. She recalls eating mixed nuts, including cashews, almonds, and peanuts, without immediate symptoms. However, the following morning, after eating peanut butter with fruit, she experienced tingling of the lips.  She also experiences tingling of the mouth when consuming peppermint tea. This symptom is similar to the tingling experienced with nuts.  She has a known reaction to cinnamon, which she avoids, as it causes more severe symptoms than the tingling experienced with nuts.  No difficulty breathing during episodes of tingling.      2025 labs: "Blood work was negative to peanut and tree nut panel.  Negative to cinnamon."   Assessment and Plan: Marilyn Hess is a 78 y.o. female with: Other adverse food reactions, not elsewhere classified, subsequent encounter Past history - reaction with lip and tongue swelling, heart racing, and tingling in the mouth. Symptoms resolved within 15-20 minutes without medication. Avoidance of cinnamon has been successful in preventing further reactions. Recent reaction with lip tingling after eating mixed nuts and  peanut butter. Patient has been avoiding these foods but used to tolerate peanuts/tree nuts previously with no issues.  Interim history - 2025 labs negative to peanut, tree nuts and cinnamon. Today's skin testing negative to peanuts, tree nuts and cinnamon. Continue strict avoidance of cinnamon.  Return for peanut butter challenge.  Avoid peanuts and tree nuts until food challenge is done.  For mild symptoms you can take over the counter antihistamines such as Benadryl 1-2 tablets = 25-50mg  and monitor symptoms closely. If symptoms worsen or if you have severe symptoms including breathing issues, throat closure, significant swelling, whole body hives, severe diarrhea and vomiting, lightheadedness then inject epinephrine and seek immediate medical care afterwards. Emergency action plan in place.  Return for Food challenge.  No orders of the defined types were placed in this encounter.  Lab Orders  No laboratory test(s) ordered today    Diagnostics: Skin Testing: Select foods. Today's skin testing negative to peanuts, tree nuts and cinnamon. Results discussed with patient/family.  Food Adult Perc - 01/01/24 0900     Time Antigen Placed 2130    Allergen Manufacturer Waynette Buttery    Location Back    Number of allergen test 12     Control-buffer 50% Glycerol Negative    Control-Histamine 3+    1. Peanut Negative    10. Cashew Negative    11. Walnut Food Negative    12. Almond Negative    13. Hazelnut Negative    14. Pecan Food Negative    15. Pistachio Negative    16. [place] Nut Negative    17. Coconut Negative    67. Cinnamon Negative  Previous notes and tests were reviewed. The plan was reviewed with the patient/family, and all questions/concerned were addressed.  It was my pleasure to see Marilyn Hess today and participate in her care. Please feel free to contact me with any questions or concerns.  Sincerely,  Wyline Mood, DO Allergy & Immunology  [facility] office: [phone] [facility] office: [phone]

## 2024-01-01 ENCOUNTER — Ambulatory Visit: Admitting: Allergy

## 2024-01-01 ENCOUNTER — Encounter: Payer: Self-pay | Admitting: Allergy

## 2024-01-01 DIAGNOSIS — T781XXD Other adverse food reactions, not elsewhere classified, subsequent encounter: Secondary | ICD-10-CM | POA: Diagnosis not present

## 2024-01-01 NOTE — Patient Instructions (Addendum)
 Today's skin testing negative to peanuts, tree nuts and cinnamon.  Results given.  Food allergies Continue strict avoidance of cinnamon.  Avoid peanuts and tree nuts until food challenge is done.  For mild symptoms you can take over the counter antihistamines such as Benadryl 1-2 tablets = 25-50mg  and monitor symptoms closely. If symptoms worsen or if you have severe symptoms including breathing issues, throat closure, significant swelling, whole body hives, severe diarrhea and vomiting, lightheadedness then inject epinephrine and seek immediate medical care afterwards. Emergency action plan in place.  Return for smooth peanut butter challenge.   Food challenge instructions: You must be off antihistamines for 3-5 days before. Must be in good health and not ill. No vaccines/injections/antibiotics within the past 7 days.  Plan on being in the office for 2-3 hours and must bring in the food you want to do the oral challenge for - bring smooth peanut butter.  You must call to schedule an appointment and specify it's for a food challenge.

## 2024-01-14 NOTE — Progress Notes (Unsigned)
 Follow Up Note  RE: Marilyn Hess MRN: [id number] DOB: 1946/09/20 Date of Office Visit: 01/15/2024  Referring provider: Pearline Cables, MD Primary care provider: Pearline Cables, MD  Chief Complaint:No chief complaint on file.   Assessment and Plan: Marilyn Hess is a 78 y.o. female with: No problem-specific Assessment & Plan notes found for this encounter.  No follow-ups on file.  Challenge food: *** Challenge as per protocol: {Blank single:19197::"Passed","Failed"} Total time: ***  Do not eat challenge food for next 24 hours and monitor for hives, swelling, shortness of breath and dizziness. If you see these symptoms, use Benadryl for mild symptoms and epinephrine for more severe symptoms and call 911.  If no adverse symptoms in the next 24 hours, repeat the challenge food the next day and observe for 1 hour. If no adverse symptoms, can eat the food on regular basis.   History of Present Illness: I had the pleasure of seeing Marilyn Hess for a follow up visit at the [facility] on 01/14/2024. She is a 78 y.o. female, who is being followed for adverse food reactions. Her previous allergy office visit was on 01/01/2024 with Dr. Selena Batten. Today she is here for peanut butter food challenge.   Discussed the use of AI scribe software for clinical note transcription with the patient, who gave verbal consent to proceed.  History of Present Illness             History of Reaction: Other adverse food reactions, not elsewhere classified, subsequent encounter Past history - reaction with lip and tongue swelling, heart racing, and tingling in the mouth. Symptoms resolved within 15-20 minutes without medication. Avoidance of cinnamon has been successful in preventing further reactions. Recent reaction with lip tingling after eating mixed nuts and peanut butter. Patient has been avoiding these foods but used to tolerate peanuts/tree nuts previously with no issues.   Interim history - 2025 labs negative to peanut, tree nuts and cinnamon. Today's skin testing negative to peanuts, tree nuts and cinnamon. Continue strict avoidance of cinnamon.  Return for peanut butter challenge.  Avoid peanuts and tree nuts until food challenge is done.  For mild symptoms you can take over the counter antihistamines such as Benadryl 1-2 tablets = 25-50mg  and monitor symptoms closely. If symptoms worsen or if you have severe symptoms including breathing issues, throat closure, significant swelling, whole body hives, severe diarrhea and vomiting, lightheadedness then inject epinephrine and seek immediate medical care afterwards. Emergency action plan in place.  Labs/skin testing: 2025 skin testing negative to peanuts.  Component     Latest Ref Rng 12/18/2023  Peanut, IgE     Class 0 kU/L <0.10   F017-IgE Hazelnut ([place])     Class 0 kU/L <0.10   F256-IgE Walnut     Class 0 kU/L <0.10   F202-IgE Cashew Nut     Class 0 kU/L <0.10   F018-IgE [place] Nut     Class 0 kU/L <0.10   Macadamia Nut, IgE     Class 0 kU/L <0.10   Pecan Nut IgE     Class 0 kU/L <0.10   F203-IgE Pistachio Nut     Class 0 kU/L <0.10   F020-IgE Almond     Class 0 kU/L <0.10      Interval History: Patient has not been ill, she has not had any accidental exposures to the culprit food.   Recent/Current History: Pulmonary disease: {Blank single:19197::"yes","no"} Cardiac disease: {Blank single:19197::"yes","no"} Respiratory infection: {  Blank single:19197::"yes","no"} Rash: {Blank single:19197::"yes","no"} Itch: {Blank single:19197::"yes","no"} Swelling: {Blank single:19197::"yes","no"} Cough: {Blank single:19197::"yes","no"} Shortness of breath: {Blank single:19197::"yes","no"} Runny/stuffy nose: {Blank single:19197::"yes","no"} Itchy eyes: {Blank single:19197::"yes","no"} Beta-blocker use: {Blank single:19197::"yes","no"}  Patient/guardian was informed of the test procedure with  verbalized understanding of the risk of anaphylaxis. Consent was signed.   Last antihistamine use: *** Last beta-blocker use: ***  Medication List:  Current Outpatient Medications  Medication Sig Dispense Refill  . alendronate (FOSAMAX) 70 MG tablet Take 1 tablet (70 mg total) by mouth once a week. Take with full glass of water on empty stomach 12 tablet 3  . EPINEPHrine (EPIPEN 2-PAK) 0.3 mg/0.3 mL IJ SOAJ injection Inject 0.3 mg into the muscle as needed for anaphylaxis. 1 each PRN   No current facility-administered medications for this visit.    Allergies: Allergies  Allergen Reactions  . Cinnamon     Headache, breathing problem to long exposure    I reviewed her past medical history, social history, family history, and environmental history and no significant changes have been reported from her previous visit.   Review of Systems  Constitutional:  Negative for appetite change, chills, fever and unexpected weight change.  HENT:  Negative for congestion and rhinorrhea.   Eyes:  Negative for itching.  Respiratory:  Negative for cough, chest tightness, shortness of breath and wheezing.   Cardiovascular:  Negative for chest pain.  Gastrointestinal:  Negative for abdominal pain.  Genitourinary:  Negative for difficulty urinating.  Skin:  Negative for rash.  Neurological:  Negative for headaches.   Objective: There were no vitals taken for this visit. There is no height or weight on file to calculate BMI. Physical Exam Vitals and nursing note reviewed.  Constitutional:      Appearance: Normal appearance. She is well-developed.  HENT:     Head: Normocephalic and atraumatic.     Right Ear: Tympanic membrane and external ear normal.     Left Ear: Tympanic membrane and external ear normal.     Nose: Nose normal.     Mouth/Throat:     Mouth: Mucous membranes are moist.     Pharynx: Oropharynx is clear.  Eyes:     Conjunctiva/sclera: Conjunctivae normal.  Cardiovascular:      Rate and Rhythm: Normal rate and regular rhythm.     Heart sounds: Normal heart sounds. No murmur heard.    No friction rub. No gallop.  Pulmonary:     Effort: Pulmonary effort is normal.     Breath sounds: Normal breath sounds. No wheezing, rhonchi or rales.  Musculoskeletal:     Cervical back: Neck supple.  Skin:    General: Skin is warm.     Findings: No rash.  Neurological:     Mental Status: She is alert and oriented to person, place, and time.  Psychiatric:        Behavior: Behavior normal.   Diagnostics: Spirometry:  Tracings reviewed. Her effort: {Blank single:19197::"Good reproducible efforts.","It was hard to get consistent efforts and there is a question as to whether this reflects a maximal maneuver.","Poor effort, data can not be interpreted."} FVC: ***L FEV1: ***L, ***% predicted FEV1/FVC ratio: ***% Interpretation: {Blank single:19197::"Spirometry consistent with mild obstructive disease","Spirometry consistent with moderate obstructive disease","Spirometry consistent with severe obstructive disease","Spirometry consistent with possible restrictive disease","Spirometry consistent with mixed obstructive and restrictive disease","Spirometry uninterpretable due to technique","Spirometry consistent with normal pattern","No overt abnormalities noted given today's efforts"}.  Please see scanned spirometry results for details.  Skin Testing: {Blank single:19197::"None","Deferred due to  recent antihistamines use"}. Positive test to: ***. Negative test to: ***.  Results discussed with patient/family.   Previous notes and tests were reviewed. The plan was reviewed with the patient/family, and all questions/concerned were addressed.  It was my pleasure to see Marilyn Hess today and participate in her care. Please feel free to contact me with any questions or concerns.  Sincerely,  Marilyn Mood, DO Allergy & Immunology  [facility]  office: [phone] [facility] office: [phone]

## 2024-01-15 ENCOUNTER — Encounter: Payer: Self-pay | Admitting: Allergy

## 2024-01-15 ENCOUNTER — Ambulatory Visit (INDEPENDENT_AMBULATORY_CARE_PROVIDER_SITE_OTHER): Admitting: Allergy

## 2024-01-15 ENCOUNTER — Other Ambulatory Visit: Payer: Self-pay

## 2024-01-15 DIAGNOSIS — T781XXD Other adverse food reactions, not elsewhere classified, subsequent encounter: Secondary | ICD-10-CM | POA: Diagnosis not present

## 2024-01-15 NOTE — Patient Instructions (Addendum)
 You tolerated 25 g of peanut butter today without any issues.   Do not eat challenge food for next 24 hours and monitor for hives, swelling, shortness of breath and dizziness. If you see these symptoms, use Benadryl for mild symptoms and epinephrine for more severe symptoms and call 911.  If no adverse symptoms in the next 24 hours, repeat the challenge food the next day and observe for 1 hour. If no adverse symptoms, can eat the food on regular basis.   Food allergies Continue strict avoidance of cinnamon.  You may try tree nuts at home one by one or we can schedule for an in office mixed tree nut butter if interested.   For mild symptoms you can take over the counter antihistamines such as Benadryl 1-2 tablets = 25-50mg  and monitor symptoms closely. If symptoms worsen or if you have severe symptoms including breathing issues, throat closure, significant swelling, whole body hives, severe diarrhea and vomiting, lightheadedness then inject epinephrine and seek immediate medical care afterwards. Emergency action plan in place.  Follow up as needed.

## 2024-05-15 ENCOUNTER — Ambulatory Visit: Payer: Self-pay

## 2024-05-15 DIAGNOSIS — S46212A Strain of muscle, fascia and tendon of other parts of biceps, left arm, initial encounter: Secondary | ICD-10-CM | POA: Diagnosis not present

## 2024-05-15 DIAGNOSIS — S40022A Contusion of left upper arm, initial encounter: Secondary | ICD-10-CM | POA: Diagnosis not present

## 2024-05-15 DIAGNOSIS — M25522 Pain in left elbow: Secondary | ICD-10-CM | POA: Diagnosis not present

## 2024-05-15 NOTE — Telephone Encounter (Signed)
  FYI Only or Action Required?: FYI only for provider.  Patient was last seen in primary care on 11/27/2023 by Copland, Harlene BROCKS, MD.  Called Nurse Triage reporting Arm Pain.  Symptoms began yesterday.  Interventions attempted: OTC medications: ibuprofen.  Symptoms are: gradually worsening.  Triage Disposition: See PCP When Office is Open (Within 3 Days)  Patient/caregiver understands and will follow disposition?: Yes  Copied from CRM #[id number]. Topic: Clinical - Red Word Triage >> May 15, 2024 10:07 AM Marilyn Hess wrote: Red Word that prompted transfer to Nurse Triage: Arm is very swollen and strange red spot, strip right above elbow joint. Patient has issues with left rotator cuff. Reason for Disposition  [1] MODERATE pain (e.g., interferes with normal activities) AND [2] present > 3 days  Answer Assessment - Initial Assessment Questions 1. ONSET: When did the pain start?     About 12 hours ago 2. LOCATION: Where is the pain located?     Left arm 3. PAIN: How bad is the pain? (Scale 0-10; or none, mild, moderate, severe)     7/10 with activity, minimal pain at rest 4. WORK OR EXERCISE: Has there been any recent work or exercise that involved this part of the body?     Patient thinks that she may have overdone it at work yesterday and aggravated existing injury 5. CAUSE: What do you think is causing the arm pain?     Left rotator cuff injury about one month ago, may have overdone it work yesterday 6. OTHER SYMPTOMS: Do you have any other symptoms? (e.g., neck pain, swelling, rash, fever, numbness, weakness)      Reports swelling from shoulder to elbow today and a red patch above her elbow since last night that is most bothersome to patient  Protocols used: Arm Pain-A-AH

## 2024-07-02 ENCOUNTER — Ambulatory Visit (INDEPENDENT_AMBULATORY_CARE_PROVIDER_SITE_OTHER)

## 2024-07-02 DIAGNOSIS — Z0000 Encounter for general adult medical examination without abnormal findings: Secondary | ICD-10-CM

## 2024-07-02 NOTE — Progress Notes (Signed)
 Subjective:   Marilyn Hess is a 78 y.o. who presents for a Medicare Wellness preventive visit.  As a reminder, Annual Wellness Visits don't include a physical exam, and some assessments may be limited, especially if this visit is performed virtually. We may recommend an in-person follow-up visit with your provider if needed.  Visit Complete: Virtual I connected with  Marilyn Hess on 07/02/24 by a audio enabled telemedicine application and verified that I am speaking with the correct person using two identifiers.  Patient Location: Home  Provider Location: Home Office  I discussed the limitations of evaluation and management by telemedicine. The patient expressed understanding and agreed to proceed.  Vital Signs: Because this visit was a virtual/telehealth visit, some criteria may be missing or patient reported. Any vitals not documented were not able to be obtained and vitals that have been documented are patient reported.  VideoError- Librarian, academic were attempted between this provider and patient, however failed, due to patient having technical difficulties OR patient did not have access to video capability.  We continued and completed visit with audio only.   Persons Participating in Visit: Patient.  AWV Questionnaire: No: Patient Medicare AWV questionnaire was not completed prior to this visit.  Cardiac Risk Factors include: advanced age (>73men, >66 women);dyslipidemia     Objective:    Today's Vitals   There is no height or weight on file to calculate BMI.     07/02/2024    9:04 AM 07/03/2023    2:04 PM 06/28/2022    8:35 AM 04/20/2021    9:13 AM 04/14/2020    9:41 AM 04/13/2019   10:11 AM 04/11/2018   11:09 AM  Advanced Directives  Does Patient Have a Medical Advance Directive? Yes No No Yes No No No   Type of Clinical research associate of [place];Living will     Does patient want to make  changes to medical advance directive?     No - Patient declined    Copy of Healthcare Power of Attorney in Chart? No - copy requested   No - copy requested     Would patient like information on creating a medical advance directive?  No - Patient declined No - Patient declined   No - Patient declined  Yes (MAU/Ambulatory/Procedural Areas - Information given)      Data saved with a previous flowsheet row definition    Current Medications (verified) Outpatient Encounter Medications as of 07/02/2024  Medication Sig   alendronate  (FOSAMAX ) 70 MG tablet Take 1 tablet (70 mg total) by mouth once a week. Take with full glass of water on empty stomach   EPINEPHrine  (EPIPEN  2-PAK) 0.3 mg/0.3 mL IJ SOAJ injection Inject 0.3 mg into the muscle as needed for anaphylaxis.   No facility-administered encounter medications on file as of 07/02/2024.    Allergies (verified) Cinnamon   History: Past Medical History:  Diagnosis Date   Breast lump    Heart murmur    Hyperlipidemia    PVC's (premature ventricular contractions)    Past Surgical History:  Procedure Laterality Date   CARPAL TUNNEL RELEASE  11/22/2014   COLONOSCOPY  2016   FOOT FRACTURE SURGERY     TOOTH EXTRACTION  07/23/2011   Family History  Adopted: Yes  Problem Relation Age of Onset   Alzheimer's disease Mother    Stroke Father    Alcohol abuse Father    Hypertension Father    Hypertension  Brother    Colon cancer Neg Hx    Colon polyps Neg Hx    Esophageal cancer Neg Hx    Stomach cancer Neg Hx    Rectal cancer Neg Hx    Social History   Socioeconomic History   Marital status: Divorced    Spouse name: Not on file   Number of children: Not on file   Years of education: Not on file   Highest education level: Not on file  Occupational History   Not on file  Tobacco Use   Smoking status: Never   Smokeless tobacco: Never  Vaping Use   Vaping status: Never Used  Substance and Sexual Activity   Alcohol use: No     Alcohol/week: 0.0 standard drinks of alcohol   Drug use: No   Sexual activity: Never  Other Topics Concern   Not on file  Social History Narrative   Not on file   Social Drivers of Health   Financial Resource Strain: Low Risk  (07/02/2024)   Overall Financial Resource Strain (CARDIA)    Difficulty of Paying Living Expenses: Not hard at all  Food Insecurity: No Food Insecurity (07/02/2024)   Hunger Vital Sign    Worried About Running Out of Food in the Last Year: Never true    Ran Out of Food in the Last Year: Never true  Transportation Needs: No Transportation Needs (07/02/2024)   PRAPARE - Administrator, Civil Service (Medical): No    Lack of Transportation (Non-Medical): No  Physical Activity: Inactive (07/02/2024)   Exercise Vital Sign    Days of Exercise per Week: 0 days    Minutes of Exercise per Session: 0 min  Stress: No Stress Concern Present (07/02/2024)   [employer] of Occupational Health - Occupational Stress Questionnaire    Feeling of Stress: Only a little  Social Connections: Moderately Isolated (07/02/2024)   Social Connection and Isolation Panel    Frequency of Communication with Friends and Family: More than three times a week    Frequency of Social Gatherings with Friends and Family: Once a week    Attends Religious Services: More than 4 times per year    Active Member of [employer] or Organizations: No    Attends Engineer, structural: Never    Marital Status: Divorced    Tobacco Counseling Counseling given: Not Answered    Clinical Intake:  Pre-visit preparation completed: Yes  Pain : No/denies pain     Nutritional Risks: None Diabetes: No  Lab Results  Component Value Date   HGBA1C 6.0 05/20/2023   HGBA1C 6.0 05/17/2022   HGBA1C 5.8 01/26/2021     How often do you need to have someone help you when you read instructions, pamphlets, or other written materials from your doctor or pharmacy?: 1 - Never  Interpreter  Needed?: No  Information entered by :: NAllen LPN   Activities of Daily Living     07/02/2024    8:47 AM  In your present state of health, do you have any difficulty performing the following activities:  Hearing? 0  Vision? 0  Difficulty concentrating or making decisions? 0  Walking or climbing stairs? 0  Dressing or bathing? 0  Doing errands, shopping? 0  Preparing Food and eating ? N  Using the Toilet? N  In the past six months, have you accidently leaked urine? N  Do you have problems with loss of bowel control? N  Managing your Medications? N  Managing  your Finances? N  Housekeeping or managing your Housekeeping? N    Patient Care Team: Copland, Harlene BROCKS, MD as PCP - General (Family Medicine) Lonni Slain, MD as PCP - Cardiology (Cardiology) Haverstock, Tawni CROME, MD as Referring Physician (Dermatology)  I have updated your Care Teams any recent Medical Services you may have received from other providers in the past year.     Assessment:   This is a routine wellness examination for Opal.  Hearing/Vision screen Hearing Screening - Comments:: Denies hearing issues Vision Screening - Comments:: No regular eye exams, [facility]   Goals Addressed             This Visit's Progress    Patient Stated       07/02/2024, wants to lose weight       Depression Screen     07/02/2024    9:07 AM 11/27/2023   11:12 AM 07/03/2023    2:04 PM 05/20/2023    8:16 AM 06/28/2022    8:38 AM 05/17/2022    8:54 AM 04/20/2021    9:19 AM  PHQ 2/9 Scores  PHQ - 2 Score 0 0 0 0 0 2 1  PHQ- 9 Score 3     2     Fall Risk     07/02/2024    9:06 AM 11/27/2023   11:12 AM 07/03/2023    1:57 PM 05/20/2023    8:16 AM 06/28/2022    8:38 AM  Fall Risk   Falls in the past year? 1 1 1  0 0  Comment missed a step  tripped doing some yard work    Number falls in past yr: 0 1 0 0 0  Injury with Fall? 0 1 0 0 0  Risk for fall due to : Medication side effect Other (Comment) No  Fall Risks No Fall Risks No Fall Risks  Risk for fall due to: Comment  fall down stepps     Follow up Falls evaluation completed;Falls prevention discussed Falls evaluation completed Falls evaluation completed Falls evaluation completed Falls evaluation completed      Data saved with a previous flowsheet row definition    MEDICARE RISK AT HOME:  Medicare Risk at Home Any stairs in or around the home?: Yes If so, are there any without handrails?: No Home free of loose throw rugs in walkways, pet beds, electrical cords, etc?: No Adequate lighting in your home to reduce risk of falls?: Yes Life alert?: No Use of a cane, walker or w/c?: No Grab bars in the bathroom?: No Shower chair or bench in shower?: No Elevated toilet seat or a handicapped toilet?: Yes  TIMED UP AND GO:  Was the test performed?  No  Cognitive Function: 6CIT completed        07/02/2024    9:09 AM 07/03/2023    2:06 PM 06/28/2022    8:53 AM  6CIT Screen  What Year? 0 points 0 points 0 points  What month? 0 points 0 points 0 points  What time? 3 points 0 points 0 points  Count back from 20 0 points 0 points 0 points  Months in reverse 0 points 0 points 0 points  Repeat phrase 2 points 0 points 0 points  Total Score 5 points 0 points 0 points    Immunizations Immunization History  Administered Date(s) Administered   Fluad Quad(high Dose 65+) 07/23/2019   INFLUENZA, HIGH DOSE SEASONAL PF 07/08/2017, 07/21/2018   Influenza,inj,Quad PF,6+ Mos 08/05/2013, 08/08/2015  PFIZER Comirnaty(Gray Top)Covid-19 Tri-Sucrose Vaccine 01/26/2021   PFIZER(Purple Top)SARS-COV-2 Vaccination 11/30/2019, 12/18/2019, 08/06/2020   Pneumococcal Conjugate-13 08/05/2014   Pneumococcal Polysaccharide-23 08/08/2015   Tdap 06/07/2015   Zoster, Live 02/21/2016    Screening Tests Health Maintenance  Topic Date Due   Zoster Vaccines- Shingrix (1 of 2) 07/12/1998   Influenza Vaccine  05/22/2024   COVID-19 Vaccine (5 - 2025-26  season) 06/22/2024   Mammogram  06/30/2024   DTaP/Tdap/Td (2 - Td or Tdap) 06/06/2025   Medicare Annual Wellness (AWV)  07/02/2025   Pneumococcal Vaccine: 50+ Years  Completed   DEXA SCAN  Completed   Hepatitis C Screening  Completed   HPV VACCINES  Aged Out   Meningococcal B Vaccine  Aged Out   Colonoscopy  Discontinued    Health Maintenance Items Addressed: Due for flu, covid and shingles. States will call to schedule mammogram.  Additional Screening:  Vision Screening: Recommended annual ophthalmology exams for early detection of glaucoma and other disorders of the eye. Is the patient up to date with their annual eye exam?  No  Who is the provider or what is the name of the office in which the patient attends annual eye exams? [facility]  Dental Screening: Recommended annual dental exams for proper oral hygiene  Community Resource Referral / Chronic Care Management: CRR required this visit?  No   CCM required this visit?  No   Plan:    I have personally reviewed and noted the following in the patient's chart:   Medical and social history Use of alcohol, tobacco or illicit drugs  Current medications and supplements including opioid prescriptions. Patient is not currently taking opioid prescriptions. Functional ability and status Nutritional status Physical activity Advanced directives List of other physicians Hospitalizations, surgeries, and ER visits in previous 12 months Vitals Screenings to include cognitive, depression, and falls Referrals and appointments  In addition, I have reviewed and discussed with patient certain preventive protocols, quality metrics, and best practice recommendations. A written personalized care plan for preventive services as well as general preventive health recommendations were provided to patient.   Ardella FORBES Dawn, LPN   [id number]   After Visit Summary: (Pick Up) Due to this being a telephonic visit, with patients personalized  plan was offered to patient and patient has requested to Pick up at office.  Notes: Nothing significant to report at this time.

## 2024-07-02 NOTE — Patient Instructions (Signed)
 Marilyn Hess,  Thank you for taking the time for your Medicare Wellness Visit. I appreciate your continued commitment to your health goals. Please review the care plan we discussed, and feel free to reach out if I can assist you further.  Medicare recommends these wellness visits once per year to help you and your care team stay ahead of potential health issues. These visits are designed to focus on prevention, allowing your provider to concentrate on managing your acute and chronic conditions during your regular appointments.  Please note that Annual Wellness Visits do not include a physical exam. Some assessments may be limited, especially if the visit was conducted virtually. If needed, we may recommend a separate in-person follow-up with your provider.  Ongoing Care Seeing your primary care provider every 3 to 6 months helps us  monitor your health and provide consistent, personalized care.   Referrals If a referral was made during today's visit and you haven't received any updates within two weeks, please contact the referred provider directly to check on the status.  Recommended Screenings:  Health Maintenance  Topic Date Due   Zoster (Shingles) Vaccine (1 of 2) 07/12/1998   Flu Shot  05/22/2024   COVID-19 Vaccine (5 - 2025-26 season) 06/22/2024   Breast Cancer Screening  06/30/2024   DTaP/Tdap/Td vaccine (2 - Td or Tdap) 06/06/2025   Medicare Annual Wellness Visit  07/02/2025   Pneumococcal Vaccine for age over 4  Completed   DEXA scan (bone density measurement)  Completed   Hepatitis C Screening  Completed   HPV Vaccine  Aged Out   Meningitis B Vaccine  Aged Out   Colon Cancer Screening  Discontinued       07/02/2024    9:04 AM  Advanced Directives  Does Patient Have a Medical Advance Directive? Yes  Type of Advance Directive Healthcare Power of Attorney  Copy of Healthcare Power of Attorney in Chart? No - copy requested   Advance Care Planning is important because  it: Ensures you receive medical care that aligns with your values, goals, and preferences. Provides guidance to your family and loved ones, reducing the emotional burden of decision-making during critical moments.  Vision: Annual vision screenings are recommended for early detection of glaucoma, cataracts, and diabetic retinopathy. These exams can also reveal signs of chronic conditions such as diabetes and high blood pressure.  Dental: Annual dental screenings help detect early signs of oral cancer, gum disease, and other conditions linked to overall health, including heart disease and diabetes.  Please see the attached documents for additional preventive care recommendations.

## 2024-08-13 ENCOUNTER — Other Ambulatory Visit (HOSPITAL_BASED_OUTPATIENT_CLINIC_OR_DEPARTMENT_OTHER): Payer: Self-pay | Admitting: Family Medicine

## 2024-08-13 DIAGNOSIS — Z1231 Encounter for screening mammogram for malignant neoplasm of breast: Secondary | ICD-10-CM

## 2024-08-24 ENCOUNTER — Ambulatory Visit (HOSPITAL_BASED_OUTPATIENT_CLINIC_OR_DEPARTMENT_OTHER)
Admission: RE | Admit: 2024-08-24 | Discharge: 2024-08-24 | Disposition: A | Discharge status: Home / Self Care | Source: Ambulatory Visit | Attending: Family Medicine | Admitting: Family Medicine

## 2024-08-24 ENCOUNTER — Encounter (HOSPITAL_BASED_OUTPATIENT_CLINIC_OR_DEPARTMENT_OTHER): Payer: Self-pay

## 2024-08-24 DIAGNOSIS — Z1231 Encounter for screening mammogram for malignant neoplasm of breast: Secondary | ICD-10-CM | POA: Insufficient documentation

## 2024-11-04 ENCOUNTER — Ambulatory Visit

## 2024-11-04 ENCOUNTER — Other Ambulatory Visit: Payer: Self-pay | Admitting: Family Medicine

## 2024-11-04 DIAGNOSIS — M85859 Other specified disorders of bone density and structure, unspecified thigh: Secondary | ICD-10-CM

## 2024-11-04 NOTE — Telephone Encounter (Signed)
 Copied from CRM [id number]. Topic: Clinical - Medication Refill >> Nov 04, 2024  4:20 PM Shereese L wrote: Medication: alendronate  (FOSAMAX ) 70 MG tablet    Has the patient contacted their pharmacy? Yes (Agent: If no, request that the patient contact the pharmacy for the refill. If patient does not wish to contact the pharmacy document the reason why and proceed with request.) (Agent: If yes, when and what did the pharmacy advise?)  This is the patient's preferred pharmacy:  [facility] [address] [place] - [address] [place] [address] Phone: [phone] Fax: [phone]  Is this the correct pharmacy for this prescription? Yes If no, delete pharmacy and type the correct one.   Has the prescription been filled recently? Yes  Is the patient out of the medication? Yes  Has the patient been seen for an appointment in the last year OR does the patient have an upcoming appointment? Yes  Can we respond through MyChart? Yes  Agent: Please be advised that Rx refills may take up to 3 business days. We ask that you follow-up with your pharmacy.

## 2024-11-05 ENCOUNTER — Ambulatory Visit (INDEPENDENT_AMBULATORY_CARE_PROVIDER_SITE_OTHER)

## 2024-11-05 DIAGNOSIS — Z23 Encounter for immunization: Secondary | ICD-10-CM

## 2024-11-05 MED ORDER — ALENDRONATE SODIUM 70 MG PO TABS: 70.0000 mg | ORAL_TABLET | ORAL | 3 refills | Status: AC

## 2024-11-05 NOTE — Progress Notes (Signed)
 Pt was in office today for influenza vaccine, vaccine was given in R deltoid and pt tolerated well.

## 2025-07-15 ENCOUNTER — Ambulatory Visit
